# Patient Record
Sex: Male | Born: 1990 | Race: Black or African American | Hispanic: No | Marital: Single | Smoking: Never smoker
Health system: Southern US, Academic
[De-identification: ages and names within clinical notes are randomized; demographics above are authoritative.]

## PROBLEM LIST (undated history)

## (undated) DIAGNOSIS — R569 Unspecified convulsions: Secondary | ICD-10-CM

## (undated) DIAGNOSIS — G4730 Sleep apnea, unspecified: Secondary | ICD-10-CM

## (undated) DIAGNOSIS — J45909 Unspecified asthma, uncomplicated: Secondary | ICD-10-CM

## (undated) DIAGNOSIS — F419 Anxiety disorder, unspecified: Secondary | ICD-10-CM

## (undated) DIAGNOSIS — I10 Essential (primary) hypertension: Secondary | ICD-10-CM

## (undated) HISTORY — DX: Unspecified convulsions: R56.9

## (undated) HISTORY — PX: TONSILLECTOMY: SUR1361

## (undated) HISTORY — DX: Sleep apnea, unspecified: G47.30

## (undated) HISTORY — DX: Essential (primary) hypertension: I10

## (undated) HISTORY — DX: Anxiety disorder, unspecified: F41.9

---
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
## 2009-02-28 ENCOUNTER — Emergency Department (HOSPITAL_COMMUNITY): Admission: EM | Admit: 2009-02-28 | Discharge: 2009-02-28 | Payer: Self-pay | Admitting: Emergency Medicine

## 2010-01-25 ENCOUNTER — Emergency Department (HOSPITAL_COMMUNITY): Admission: EM | Admit: 2010-01-25 | Discharge: 2010-01-25 | Payer: Self-pay | Admitting: Family Medicine

## 2010-01-28 ENCOUNTER — Emergency Department (HOSPITAL_COMMUNITY): Admission: EM | Admit: 2010-01-28 | Discharge: 2010-01-28 | Payer: Self-pay | Admitting: Emergency Medicine

## 2012-02-15 ENCOUNTER — Encounter (HOSPITAL_COMMUNITY): Payer: Self-pay | Admitting: Emergency Medicine

## 2012-02-15 ENCOUNTER — Emergency Department (HOSPITAL_COMMUNITY)
Admission: EM | Admit: 2012-02-15 | Discharge: 2012-02-15 | Disposition: A | Discharge status: Home / Self Care | Payer: Self-pay | Attending: Emergency Medicine | Admitting: Emergency Medicine

## 2012-02-15 DIAGNOSIS — R05 Cough: Secondary | ICD-10-CM | POA: Insufficient documentation

## 2012-02-15 DIAGNOSIS — J40 Bronchitis, not specified as acute or chronic: Secondary | ICD-10-CM | POA: Insufficient documentation

## 2012-02-15 DIAGNOSIS — R509 Fever, unspecified: Secondary | ICD-10-CM | POA: Insufficient documentation

## 2012-02-15 DIAGNOSIS — R6883 Chills (without fever): Secondary | ICD-10-CM | POA: Insufficient documentation

## 2012-02-15 DIAGNOSIS — R059 Cough, unspecified: Secondary | ICD-10-CM | POA: Insufficient documentation

## 2012-02-15 DIAGNOSIS — J3489 Other specified disorders of nose and nasal sinuses: Secondary | ICD-10-CM | POA: Insufficient documentation

## 2012-02-15 MED ORDER — AZITHROMYCIN 250 MG PO TABS: 250.0000 mg | ORAL_TABLET | Freq: Every day | ORAL | Status: AC

## 2012-02-15 MED ORDER — ALBUTEROL SULFATE HFA 108 (90 BASE) MCG/ACT IN AERS
2.0000 | INHALATION_SPRAY | RESPIRATORY_TRACT | Status: DC
  Administered 2012-02-15: 2 via RESPIRATORY_TRACT
  Filled 2012-02-15: qty 6.7

## 2012-02-15 NOTE — Discharge Instructions (Signed)

## 2012-02-15 NOTE — ED Provider Notes (Signed)
History   This chart was scribed for Lyanne Co, MD by Clarita Crane. The patient was seen in room STRE4/STRE4. Patient's care was started at 1713.    CSN: [id number]  Arrival date & time 02/15/12  1713   First MD Initiated Contact with Patient 02/15/12 1727      Chief Complaint  Patient presents with  . Nasal Congestion    (Consider location/radiation/quality/duration/timing/severity/associated sxs/prior treatment) HPI Peter Key is a 21 y.o. male who presents to the Emergency Department complaining of moderate to severe productive cough onset several days ago and persistent since with associated nasal congestion, SOB, fever, chills and chest tightness. Denies swelling of LE, nausea, vomiting, chest pain, weakness. Denies family h/o of blood clots.  History reviewed. No pertinent past medical history.  History reviewed. No pertinent past surgical history.  History reviewed. No pertinent family history.  History  Substance Use Topics  . Smoking status: Not on file  . Smokeless tobacco: Not on file  . Alcohol Use: Not on file      Review of Systems  Constitutional: Positive for fever and chills.  HENT: Positive for congestion.   Respiratory: Positive for cough, chest tightness and shortness of breath.   Cardiovascular: Negative for chest pain and leg swelling.  Gastrointestinal: Negative for nausea and vomiting.  Neurological: Negative for weakness.  All other systems reviewed and are negative.    Allergies  Review of patient's allergies indicates no known allergies.  Home Medications   Current Outpatient Rx  Name Route Sig Dispense Refill  . IBUPROFEN 200 MG PO TABS Oral Take 400 mg by mouth every 6 (six) hours as needed. For pain    . AZITHROMYCIN 250 MG PO TABS Oral Take 1 tablet (250 mg total) by mouth daily. Take 2 tabs for first dose, then 1 tab for each additional dose 6 tablet 0    BP 125/85  Pulse 74  Temp(Src) 97.4 F (36.3 C) (Oral)   Resp 16  SpO2 100%  Physical Exam  Nursing note and vitals reviewed. Constitutional: He is oriented to person, place, and time. He appears well-developed and well-nourished. No distress.  HENT:  Head: Normocephalic and atraumatic.  Eyes: EOM are normal. Pupils are equal, round, and reactive to light.  Neck: Neck supple. No tracheal deviation present.  Cardiovascular: Normal rate and regular rhythm.  Exam reveals no gallop and no friction rub.   No murmur heard. Pulmonary/Chest: Effort normal and breath sounds normal. No respiratory distress. He has no wheezes. He has no rales.  Abdominal: Soft. He exhibits no distension.  Musculoskeletal: Normal range of motion. He exhibits no edema.  Neurological: He is alert and oriented to person, place, and time. No sensory deficit.  Skin: Skin is warm and dry.  Psychiatric: He has a normal mood and affect. His behavior is normal.    ED Course  Procedures (including critical care time)  DIAGNOSTIC STUDIES: Oxygen Saturation is 100% on room air, normal by my interpretation.    COORDINATION OF CARE: 6:28PM- Patient informed of current plan for treatment and evaluation and agrees with plan at this time.     Labs Reviewed - No data to display No results found.   1. Bronchitis       MDM  The symptoms are suggestive of bronchitis.  His O2 sats are 100%.      I personally performed the services described in this documentation, which was scribed in my presence. The recorded information has been reviewed  and considered.      Lyanne Co, MD 02/15/12 [phone]

## 2012-02-15 NOTE — ED Notes (Signed)
Pt states chest and head congestion woke him from sleep.  Pt has yellow productive cough.  Chest feels tight but no difficulty breathing.  Pt felt he had fever last night but didn't take temp.  Pt co diarrhea that started last night.

## 2013-01-01 ENCOUNTER — Encounter (HOSPITAL_COMMUNITY): Payer: Self-pay | Admitting: Emergency Medicine

## 2013-01-01 ENCOUNTER — Emergency Department (HOSPITAL_COMMUNITY)
Admission: EM | Admit: 2013-01-01 | Discharge: 2013-01-01 | Disposition: A | Discharge status: Home / Self Care | Payer: Managed Care, Other (non HMO) | Attending: Emergency Medicine | Admitting: Emergency Medicine

## 2013-01-01 DIAGNOSIS — B9789 Other viral agents as the cause of diseases classified elsewhere: Secondary | ICD-10-CM | POA: Insufficient documentation

## 2013-01-01 DIAGNOSIS — R059 Cough, unspecified: Secondary | ICD-10-CM | POA: Insufficient documentation

## 2013-01-01 DIAGNOSIS — B349 Viral infection, unspecified: Secondary | ICD-10-CM

## 2013-01-01 DIAGNOSIS — R05 Cough: Secondary | ICD-10-CM | POA: Insufficient documentation

## 2013-01-01 NOTE — ED Provider Notes (Signed)
History    This chart was scribed for non-physician practitioner working with Lyanne Co, MD by ED Scribe, Burman Nieves. This patient was seen in room WTR9/WTR9 and the patient's care was started at 3:07 PM.   CSN: [id number]  Arrival date & time 01/01/13  1427   First MD Initiated Contact with Patient 01/01/13 1507      Chief Complaint  Patient presents with  . Sore Throat    (Consider location/radiation/quality/duration/timing/severity/associated sxs/prior treatment) Patient is a 22 y.o. male presenting with pharyngitis. The history is provided by the patient. No language interpreter was used.  Sore Throat The current episode started 12 to 24 hours ago. The problem occurs constantly. The problem has not changed since onset.Pertinent negatives include no abdominal pain and no headaches. The symptoms are aggravated by swallowing. He has tried nothing for the symptoms.  Sore Throat The current episode started 12 to 24 hours ago. The problem occurs constantly. The problem has not changed since onset.Associated symptoms include coughing and a sore throat. Pertinent negatives include no abdominal pain, chills, fever, headaches, neck pain, rash or vomiting. The symptoms are aggravated by swallowing. He has tried nothing for the symptoms.   Peter Key is a 22 y.o. male who presents to the Emergency Department complaining of moderate constant sore throat onset last night. Pt reports the soreness has a scratchy sensation.  Patient then developed a productive cough with yellow sputum this morning.  Pt reports associated chest congestion. He complains he has pain in back. He denies SOB.  Pt denies fever, chills, nausea, vomiting, diarrhea, weakness, and any other associated symptoms. Pt reports he smokes tobacco about 4 cigarettes/day. Pt has medical hx tonsils removal in the past.   History reviewed. No pertinent past medical history.  History reviewed. No pertinent past surgical  history.  No family history on file.  History  Substance Use Topics  . Smoking status: Not on file  . Smokeless tobacco: Not on file  . Alcohol Use: No      Review of Systems  Constitutional: Negative for fever and chills.  HENT: Positive for sore throat. Negative for neck pain.   Respiratory: Positive for cough.   Gastrointestinal: Negative for vomiting and abdominal pain.  Skin: Negative for rash.  Neurological: Negative for headaches.  All other systems reviewed and are negative.    Allergies  Review of patient's allergies indicates no known allergies.  Home Medications  No current outpatient prescriptions on file.  BP 125/85  Pulse 98  Temp(Src) 99.2 F (37.3 C) (Oral)  Resp 18  SpO2 99%  Physical Exam  Nursing note and vitals reviewed. Constitutional: He appears well-developed and well-nourished.  HENT:  Head: Normocephalic and atraumatic. No trismus in the jaw.  Right Ear: Tympanic membrane and ear canal normal.  Left Ear: Tympanic membrane and ear canal normal.  Nose: Nose normal. Right sinus exhibits no maxillary sinus tenderness and no frontal sinus tenderness. Left sinus exhibits no maxillary sinus tenderness and no frontal sinus tenderness.  Mouth/Throat: Uvula is midline and mucous membranes are normal. No edematous. Posterior oropharyngeal erythema present. No oropharyngeal exudate, posterior oropharyngeal edema or tonsillar abscesses.  Throat is erythematous  Patient handling secretions well Normal voice phonation  Eyes: EOM are normal. Pupils are equal, round, and reactive to light.  Neck: Normal range of motion. Neck supple.  Cardiovascular: Normal rate, regular rhythm and normal heart sounds.   Pulmonary/Chest: Effort normal and breath sounds normal. No respiratory distress. He has  no wheezes. He has no rales.  Musculoskeletal: Normal range of motion.  Lymphadenopathy:    He has cervical adenopathy.  Neurological: He is alert.  Skin: Skin is  warm and dry.  Psychiatric: He has a normal mood and affect. His behavior is normal.    ED Course  Procedures (including critical care time) DIAGNOSTIC STUDIES: Oxygen Saturation is 99% on room air, normal by my interpretation.    COORDINATION OF CARE:    3:35 PM Discussed ED treatment with pt and pt agrees.   Labs Reviewed  RAPID STREP SCREEN   No results found.   No diagnosis found.    MDM  Patient presenting with sore throat onset last evening.  Rapid strep negative.  Uvula midline.  No difficulty swallowing or breathing.  No signs of peritonsillar abscess.  Patient also complaining of cough that started earlier this morning.  No respiratory rate.  Pulse ox 99 on RA.  Lungs CTAB.  Therefore, do not feel that CXR is indicated at this time.  Suspect viral illness.  Return precautions given to the patient.  I personally performed the services described in this documentation, which was scribed in my presence. The recorded information has been reviewed and is accurate.   Pascal Lux [place], PA-C 01/01/13 1630

## 2013-01-01 NOTE — ED Notes (Signed)
MD at bedside. 

## 2013-01-01 NOTE — ED Notes (Signed)
Pt complains of sore throat, body soreness x 1 day.

## 2013-01-02 NOTE — ED Provider Notes (Signed)
Medical screening examination/treatment/procedure(s) were performed by non-physician practitioner and as supervising physician I was immediately available for consultation/collaboration.   Kevin M Campos, MD 01/02/13 0100 

## 2013-01-05 ENCOUNTER — Encounter (HOSPITAL_COMMUNITY): Payer: Self-pay | Admitting: *Deleted

## 2013-01-05 ENCOUNTER — Emergency Department (HOSPITAL_COMMUNITY): Payer: Self-pay

## 2013-01-05 ENCOUNTER — Emergency Department (HOSPITAL_COMMUNITY)
Admission: EM | Admit: 2013-01-05 | Discharge: 2013-01-06 | Disposition: A | Discharge status: Home / Self Care | Payer: Self-pay | Attending: Emergency Medicine | Admitting: Emergency Medicine

## 2013-01-05 DIAGNOSIS — R109 Unspecified abdominal pain: Secondary | ICD-10-CM | POA: Insufficient documentation

## 2013-01-05 LAB — URINALYSIS, ROUTINE W REFLEX MICROSCOPIC
Bilirubin Urine: NEGATIVE
Hgb urine dipstick: NEGATIVE
Ketones, ur: NEGATIVE mg/dL
Leukocytes, UA: NEGATIVE
Urobilinogen, UA: 1 mg/dL (ref 0.0–1.0)

## 2013-01-05 MED ORDER — MORPHINE SULFATE 4 MG/ML IJ SOLN
6.0000 mg | Freq: Once | INTRAMUSCULAR | Status: AC
  Administered 2013-01-06: 6 mg via INTRAVENOUS
  Filled 2013-01-05: qty 2

## 2013-01-05 NOTE — ED Notes (Signed)
Sharp abdominal pain,  Denies nausea or vomiting,  No diarrhea,  Pain gets worse when he eats,  Even wakes him up at night,  Pain last 3 to 5 minutes,  No constipation,  No diarrhea

## 2013-01-05 NOTE — ED Notes (Signed)
Correction to prior entry,  Pt states he does have diarrhea "really bad"

## 2013-01-05 NOTE — ED Notes (Signed)
Patient transported to X-ray 

## 2013-01-06 LAB — COMPREHENSIVE METABOLIC PANEL
AST: 26 U/L (ref 0–37)
Albumin: 4.3 g/dL (ref 3.5–5.2)
Alkaline Phosphatase: 68 U/L (ref 39–117)
CO2: 26 mEq/L (ref 19–32)
Calcium: 9.3 mg/dL (ref 8.4–10.5)
GFR calc Af Amer: >90 mL/min (ref >90)
Potassium: 3.7 mEq/L (ref 3.5–5.1)
Sodium: 136 mEq/L (ref 135–145)

## 2013-01-06 LAB — CBC
HCT: 45.8 % (ref 39.0–52.0)
Hemoglobin: 16.2 g/dL (ref 13.0–17.0)
MCHC: 35.4 g/dL (ref 30.0–36.0)
Platelets: 208 10*3/uL (ref 150–400)
RBC: 5.42 MIL/uL (ref 4.22–5.81)
RDW: 13.1 % (ref 11.5–15.5)

## 2013-01-06 MED ORDER — HYDROCODONE-ACETAMINOPHEN 5-325 MG PO TABS: 1.0000 | ORAL_TABLET | ORAL | Status: DC | PRN

## 2013-01-06 NOTE — ED Provider Notes (Signed)
History     CSN: [id number]  Arrival date & time 01/05/13  2235   First MD Initiated Contact with Patient 01/05/13 2258      Chief Complaint  Patient presents with  . Abdominal Pain     The history is provided by the patient.   patient reports intermittent sharp abdominal pain over the past several days.  No nausea vomiting or diarrhea.  No fevers or chills.  Symptoms the pain is worsened by 18 and sometimes it just occurs.  At times it wakes him up at night.  The pain lasts for several minutes and then subsides.  Currently he is without significant discomfort.  He denies abdominal distention.  He denies alcohol abuse.  No history kidney stones.  No urinary complaints.  No History gallstones.  The symptoms at this time are only mild  History reviewed. No pertinent past medical history.  Past Surgical History  Procedure Laterality Date  . Tonsillectomy      History reviewed. No pertinent family history.  History  Substance Use Topics  . Smoking status: Never Smoker   . Smokeless tobacco: Not on file  . Alcohol Use: No      Review of Systems  All other systems reviewed and are negative.    Allergies  Review of patient's allergies indicates no known allergies.  Home Medications  No current outpatient prescriptions on file.  BP 117/102  Pulse 91  Temp(Src) 98.7 F (37.1 C) (Oral)  Resp 18  SpO2 99%  Physical Exam  Nursing note and vitals reviewed. Constitutional: He is oriented to person, place, and time. He appears well-developed and well-nourished.  HENT:  Head: Normocephalic and atraumatic.  Eyes: EOM are normal.  Neck: Normal range of motion.  Cardiovascular: Normal rate, regular rhythm, normal heart sounds and intact distal pulses.   Pulmonary/Chest: Effort normal and breath sounds normal. No respiratory distress.  Abdominal: Soft. He exhibits no distension. There is no tenderness. There is no rebound and no guarding.  Musculoskeletal: Normal range of  motion.  Neurological: He is alert and oriented to person, place, and time.  Skin: Skin is warm and dry.  Psychiatric: He has a normal mood and affect. Judgment normal.    ED Course  Procedures (including critical care time)  Labs Reviewed  URINALYSIS, ROUTINE W REFLEX MICROSCOPIC  CBC  COMPREHENSIVE METABOLIC PANEL  LIPASE, BLOOD   Dg Abd 2 Views  01/05/2013  *RADIOLOGY REPORT*  Clinical Data: The epigastric pain for 3 days.  Nausea.  ABDOMEN - 2 VIEW  Comparison: None.  Findings: Normal bowel gas pattern.  Scattered gas and stool in the colon.  No small or large bowel distension.  No free intra- abdominal air.  No abnormal air fluid levels.  No radiopaque stones.  Visualized bones appear intact.  IMPRESSION: Nonobstructive bowel gas pattern.   Original Report Authenticated By: Burman Nieves, M.D.    I personally reviewed the imaging tests through PACS system I reviewed available ER/hospitalization records through the EMR    1. abdominal pain   MDM  3:42 AM Patient feels much better at this time.  Discharge home in good condition.  The patient will need to followup with the gastroenterologist as an outpatient.  Very mild elevation in his lipase at the patient's continuing to keep food down in the emergency department.  LFTs are normal.  No indication for imaging at this time.  He understands return to ER for new or worsening symptoms  Lyanne Co, MD 01/06/13 [phone]

## 2013-01-06 NOTE — ED Notes (Signed)
Pt alert x 4 discharge papers given. V/s stable, no c/o pain will monitor.

## 2013-01-23 ENCOUNTER — Emergency Department (HOSPITAL_COMMUNITY): Payer: Managed Care, Other (non HMO)

## 2013-01-23 ENCOUNTER — Emergency Department (HOSPITAL_COMMUNITY)
Admission: EM | Admit: 2013-01-23 | Discharge: 2013-01-23 | Disposition: A | Discharge status: Home / Self Care | Payer: Managed Care, Other (non HMO) | Attending: Emergency Medicine | Admitting: Emergency Medicine

## 2013-01-23 ENCOUNTER — Encounter (HOSPITAL_COMMUNITY): Payer: Self-pay | Admitting: Emergency Medicine

## 2013-01-23 DIAGNOSIS — S298XXA Other specified injuries of thorax, initial encounter: Secondary | ICD-10-CM | POA: Insufficient documentation

## 2013-01-23 DIAGNOSIS — Y92410 Unspecified street and highway as the place of occurrence of the external cause: Secondary | ICD-10-CM | POA: Insufficient documentation

## 2013-01-23 DIAGNOSIS — S59909A Unspecified injury of unspecified elbow, initial encounter: Secondary | ICD-10-CM | POA: Insufficient documentation

## 2013-01-23 DIAGNOSIS — S6990XA Unspecified injury of unspecified wrist, hand and finger(s), initial encounter: Secondary | ICD-10-CM | POA: Insufficient documentation

## 2013-01-23 DIAGNOSIS — Y9389 Activity, other specified: Secondary | ICD-10-CM | POA: Insufficient documentation

## 2013-01-23 DIAGNOSIS — R0789 Other chest pain: Secondary | ICD-10-CM

## 2013-01-23 MED ORDER — METHOCARBAMOL 500 MG PO TABS: 500.0000 mg | ORAL_TABLET | Freq: Two times a day (BID) | ORAL | Status: DC

## 2013-01-23 MED ORDER — HYDROCODONE-ACETAMINOPHEN 5-325 MG PO TABS: 2.0000 | ORAL_TABLET | ORAL | Status: DC | PRN

## 2013-01-23 MED ORDER — OXYCODONE-ACETAMINOPHEN 5-325 MG PO TABS
1.0000 | ORAL_TABLET | Freq: Once | ORAL | Status: AC
  Administered 2013-01-23: 1 via ORAL
  Filled 2013-01-23: qty 1

## 2013-01-23 MED ORDER — IBUPROFEN 800 MG PO TABS
800.0000 mg | ORAL_TABLET | Freq: Once | ORAL | Status: AC
  Administered 2013-01-23: 800 mg via ORAL
  Filled 2013-01-23: qty 1
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
## 2013-01-23 NOTE — ED Notes (Signed)
Bed:WA02<BR> Expected date:<BR> Expected time:<BR> Means of arrival:<BR> Comments:<BR> EMS

## 2013-10-21 ENCOUNTER — Emergency Department (HOSPITAL_COMMUNITY)
Admission: EM | Admit: 2013-10-21 | Discharge: 2013-10-21 | Disposition: A | Discharge status: Home / Self Care | Payer: Managed Care, Other (non HMO) | Attending: Emergency Medicine | Admitting: Emergency Medicine

## 2013-10-21 ENCOUNTER — Encounter (HOSPITAL_COMMUNITY): Payer: Self-pay | Admitting: Emergency Medicine

## 2013-10-21 DIAGNOSIS — J40 Bronchitis, not specified as acute or chronic: Secondary | ICD-10-CM | POA: Insufficient documentation

## 2013-10-21 DIAGNOSIS — IMO0001 Reserved for inherently not codable concepts without codable children: Secondary | ICD-10-CM | POA: Insufficient documentation

## 2013-10-21 DIAGNOSIS — J029 Acute pharyngitis, unspecified: Secondary | ICD-10-CM | POA: Insufficient documentation

## 2013-10-21 DIAGNOSIS — Z9089 Acquired absence of other organs: Secondary | ICD-10-CM | POA: Insufficient documentation

## 2013-10-21 MED ORDER — BENZONATATE 100 MG PO CAPS: 100.0000 mg | ORAL_CAPSULE | Freq: Three times a day (TID) | ORAL | Status: DC

## 2013-10-21 NOTE — ED Provider Notes (Signed)
CSN: [id number]     Arrival date & time 10/21/13  1253 History   First MD Initiated Contact with Patient 10/21/13 1416    This chart was scribed for Peter Key, a non-physician practitioner working with Hurman Horn, MD by Lewanda Rife, ED Scribe. This patient was seen in room WTR6/WTR6 and the patient's care was started at 3:01 PM     Chief Complaint  Patient presents with  . Nasal Congestion  . Cough   (Consider location/radiation/quality/duration/timing/severity/associated sxs/prior Treatment) The history is provided by the patient. No language interpreter was used.   HPI Comments: Peter Key is a 22 y.o. male who presents to the Emergency Department complaining of constant worsening nasal congestion onset 2 days. Reports associated productive cough with yellow sputum, generalized myalgias, sneezing, and sore throat. Reports trying cough drops with no relief of symptoms. Denies any aggravating factors. Denies associated fever, otalgia, chills, nausea, emesis and diarrhea. Denies getting a flu shot this season. Denies smoking cigarettes.     History reviewed. No pertinent past medical history. Past Surgical History  Procedure Laterality Date  . Tonsillectomy     No family history on file. History  Substance Use Topics  . Smoking status: Never Smoker   . Smokeless tobacco: Not on file  . Alcohol Use: No    Review of Systems  Constitutional: Negative for fever and chills.  HENT: Positive for congestion and sore throat.   Respiratory: Positive for cough.   Gastrointestinal: Negative for nausea, vomiting and diarrhea.  Musculoskeletal: Positive for myalgias.  Psychiatric/Behavioral: Negative for confusion.  All other systems reviewed and are negative.    Allergies  Review of patient's allergies indicates no known allergies.  Home Medications   Current Outpatient Rx  Name  Route  Sig  Dispense  Refill  . benzonatate (TESSALON) 100 MG capsule   Oral  Take 1 capsule (100 mg total) by mouth every 8 (eight) hours.   15 capsule   0    BP 119/71  Pulse 92  Temp(Src) 98.8 F (37.1 C) (Oral)  Resp 14  SpO2 99% Physical Exam  Nursing note and vitals reviewed. Constitutional: He appears well-developed and well-nourished. No distress.  HENT:  Head: Normocephalic and atraumatic.  Right Ear: Tympanic membrane, external ear and ear canal normal.  Left Ear: Tympanic membrane, external ear and ear canal normal.  Nose: Nose normal. No mucosal edema or rhinorrhea.  Mouth/Throat: Uvula is midline and mucous membranes are normal. Mucous membranes are not dry. No trismus in the jaw. No uvula swelling. Posterior oropharyngeal erythema present. No oropharyngeal exudate, posterior oropharyngeal edema or tonsillar abscesses.  Eyes: Conjunctivae and EOM are normal. Right eye exhibits no discharge. Left eye exhibits no discharge.  Neck: Normal range of motion and full passive range of motion without pain. Neck supple. No tracheal deviation present.  No meningismus   Cardiovascular: Normal rate, regular rhythm, normal heart sounds, intact distal pulses and normal pulses.   Pulses:      Radial pulses are 2+ on the right side, and 2+ on the left side.  Pulmonary/Chest: Effort normal and breath sounds normal. No respiratory distress. He has no wheezes. He has no rales.  No rhonchi   Abdominal: Soft. There is no tenderness.  Musculoskeletal: Normal range of motion.  Neurological: He is alert.  Skin: Skin is warm and dry.  Psychiatric: He has a normal mood and affect. His behavior is normal.    ED Course  Procedures (including critical  care time)  COORDINATION OF CARE:  Nursing notes reviewed. Vital signs reviewed. Initial pt interview and examination performed.   3:06 PM-Discussed treatment plan with pt at bedside. Pt agrees with plan.   Treatment plan initiated:Medications - No data to display   Initial diagnostic testing ordered.    Labs  Review Labs Reviewed - No data to display Imaging Review No results found.  EKG Interpretation   None      Vital signs reviewed and are as follows: Filed Vitals:   10/21/13 1530  BP: 113/60  Pulse: 86  Temp: 99 F (37.2 C)  Resp: 16   Patient counseled on supportive care for viral symptoms and s/s to return including worsening symptoms, persistent fever, persistent vomiting, or if they have any other concerns.  Urged to see PCP if symptoms persist for more than 3 days. Patient verbalizes understanding and agrees with plan.   D/c to home with tessalon.    MDM   1. Bronchitis    Patient with likely viral bronchitis. No concern for PNA given normal lung exam. Antibiotics not indicated. Conservative therapy indicated.    I personally performed the services described in this documentation, which was scribed in my presence. The recorded information has been reviewed and is accurate.    [name], PA-C 10/21/13 1534

## 2013-10-21 NOTE — ED Notes (Signed)
Per pt, chest, nasal congestion since the 26 th-coughing up yellow mucous

## 2013-10-22 NOTE — ED Provider Notes (Signed)
Medical screening examination/treatment/procedure(s) were performed by non-physician practitioner and as supervising physician I was immediately available for consultation/collaboration.  Hurman Horn, MD 10/22/13 [phone]

## 2014-01-23 ENCOUNTER — Emergency Department (HOSPITAL_BASED_OUTPATIENT_CLINIC_OR_DEPARTMENT_OTHER): Payer: Managed Care, Other (non HMO)

## 2014-01-23 ENCOUNTER — Emergency Department (HOSPITAL_BASED_OUTPATIENT_CLINIC_OR_DEPARTMENT_OTHER)
Admission: EM | Admit: 2014-01-23 | Discharge: 2014-01-23 | Disposition: A | Discharge status: Home / Self Care | Payer: Managed Care, Other (non HMO) | Attending: Emergency Medicine | Admitting: Emergency Medicine

## 2014-01-23 DIAGNOSIS — Y990 Civilian activity done for income or pay: Secondary | ICD-10-CM | POA: Insufficient documentation

## 2014-01-23 DIAGNOSIS — Y9389 Activity, other specified: Secondary | ICD-10-CM | POA: Insufficient documentation

## 2014-01-23 DIAGNOSIS — T59891A Toxic effect of other specified gases, fumes and vapors, accidental (unintentional), initial encounter: Secondary | ICD-10-CM | POA: Insufficient documentation

## 2014-01-23 DIAGNOSIS — Y9289 Other specified places as the place of occurrence of the external cause: Secondary | ICD-10-CM | POA: Insufficient documentation

## 2014-01-23 DIAGNOSIS — T5991XA Toxic effect of unspecified gases, fumes and vapors, accidental (unintentional), initial encounter: Secondary | ICD-10-CM | POA: Insufficient documentation

## 2014-01-23 NOTE — ED Provider Notes (Signed)
Medical screening examination/treatment/procedure(s) were performed by non-physician practitioner and as supervising physician I was immediately available for consultation/collaboration.   EKG Interpretation None        Alfred Harrel, MD 01/23/14 2353 

## 2014-01-23 NOTE — ED Notes (Signed)
Patient transported to X-ray 

## 2014-01-23 NOTE — Discharge Instructions (Signed)
Poisoning Information, Adult  Poisoning is illness caused by eating, drinking, touching, or inhaling a harmful substance. The damaging effects on the person's health will vary depending on the type of poison, the amount of exposure, and the duration of exposure before treatment. These effects may range from mild to very severe or even fatal.   Most poisonings take place in the home and involve common household products. They can also occur in the workplace, especially in industrial or manufacturing facilities. Poisoning is more common in children than adults. However, poisoning often causes more serious illness in adults. Poisonings are often accidental, but there are also many cases in which a person intentionally ingests poison.  WHAT THINGS MAY BE POISONOUS?   A poison can be any substance that causes illness or harm to the body. Poisoning is often caused by products that are commonly found in homes. Many substances can become poisonous if used in ways or amounts that are not appropriate. Some common products that can cause poisoning are:   · Medicines, including prescription medicines, over-the-counter pain medicines, vitamins, iron pills, and herbal supplements.  · Cleaning or laundry products.  · Paint and paint thinner.  · Weed or insect killers.  · Perfume, hair spray, or nail products.  · Alcohol.  · Plants, such as philodendron, poinsettia, oleander, castor bean, cactus, and tomato plants.  · Batteries.  · Furniture polish.  · Drain cleaners.  · Antifreeze or other automotive products.  · Gasoline, lighter fluid, or lamp oil.  · Carbon monoxide gas from furnaces or automobiles.  · Toxic fumes from the burning of plastics or certain other materials.  WHAT ARE SOME FIRST-AID MEASURES FOR POISONING?  The local poison control center must be contacted whenever a person may have been exposed to poison. The poison control specialist will often give a set of directions to follow over the phone. These directions  may include the following:  · Remove any substance that is still in the mouth if the poison was not food or medicine. Drink a small amount of water.  · Keep the medicine container if too much medicine or the wrong medicine was swallowed. Use it to identify the medicine to the poison control specialist.   · Get away from the area where exposure occurred as soon as possible if the poison was from fumes or chemicals.  · Get fresh air as soon as possible if a poison was inhaled.  · Remove any affected clothing and rinse the skin with water if a poison got on the skin.   · Rinse the eyes with water if a poison or chemical got in the eyes.  · Begin cardiopulmonary resuscitation (CPR) if breathing stops.  HOW CAN YOU PREVENT POISONING?  Take these steps to help prevent poisoning:  · Keep medicines and chemical products in their original containers. Many of these come in child-safe packaging. Store them in areas out of reach of children.  · Educate others about the dangers of possible poisons.  · Read labels before using medicine or household products. Leave the original labels on the containers.  · Always turn on a light when taking medicine. Check the dosage every time.    · Close the containers tightly after using medicine or chemical products.  · Get rid of unneeded and outdated medicines by following the specific disposal instructions on the medicine label or the patient information that came with the medicine. Do not put medicine in the trash or flush it down the toilet. Use the community's   drug take-back program to dispose of medicine. If these options are not available, take the medicine out of the original container and mix it with an undesirable substance, such as coffee grounds or kitty litter. Seal the mixture in a sealable bag, can, or other container and throw it away.   · Keep all dangerous household products (such as lighter fluid, paint thinner and remover, gasoline, and antifreeze) in locked  cabinets.  · Do not mix different household chemicals with each other.  · Use protective equipment (gloves, goggles, masks, aprons) as needed when using chemicals or cleaners.  · Install a carbon monoxide detector in your home.  WHEN SHOULD YOU SEEK HELP?   Contact the poison control center whenever you suspect that a person has been exposed to poison. Call 1-800-222-1222 (in the [place] to reach a poison center for your area. If you are outside the [place] ask your caregiver what the phone number is for your local poison control center. Keep the phone number posted near your phone. Make sure everyone in your household knows where to find the number.  The local emergency services (911 in [place] must be contacted if a person has been exposed to poison and:   · Has trouble breathing or stops breathing.  · Develops chest pain.  · Has trouble staying awake or becomes unconscious.  · Has a seizure.  · Has severe vomiting or bleeding.  · Has a worsening headache.  · Has a decreased level of alertness.  · Develops a widespread rash that may or may not be painful.  · Has changes in vision.  · Has difficulty swallowing.  · Develops severe abdominal pain.  FOR MORE INFORMATION   American Association of Poison Control Centers: www.aapcc.org  Document Released: 09/27/2012 Document Reviewed: 09/27/2012  Exit[employer]® Patient Information ©2014 [employer].

## 2014-01-23 NOTE — ED Notes (Signed)
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 Denies all other complaints. RT listened to lungs while in triage and reports breath sounds are clear.

## 2014-01-23 NOTE — ED Provider Notes (Signed)
CSN: [id number]     Arrival date & time 01/23/14  1512 History   First MD Initiated Contact with Patient 01/23/14 1630     Chief Complaint  Patient presents with  . Aspiration     (Consider location/radiation/quality/duration/timing/severity/associated sxs/prior Treatment) Patient is a 23 y.o. male presenting with shortness of breath. The history is provided by the patient. No language interpreter was used.  Shortness of Breath Severity:  Moderate Onset quality:  Sudden Progression:  Improving Chronicity:  New Relieved by:  Nothing Worsened by:  Nothing tried Ineffective treatments:  None tried Associated symptoms: vomiting   Risk factors: tobacco use   Risk factors: no recent alcohol use   Pt reports he inhaled a chemical at work.  Pt was exposed for about 30 seconds.   Pt reports he coughed until he vomitted.  Pt felt like he aspirated vomit.   Pt given 02 by EMS.   Pt reports he feels back to normal now  No past medical history on file. No past surgical history on file. No family history on file. History  Substance Use Topics  . Smoking status: Not on file  . Smokeless tobacco: Not on file  . Alcohol Use: Not on file    Review of Systems  Respiratory: Positive for shortness of breath.   Gastrointestinal: Positive for vomiting.  All other systems reviewed and are negative.      Allergies  Review of patient's allergies indicates no known allergies.  Home Medications  No current outpatient prescriptions on file. BP 114/76  Pulse 82  Temp(Src) 98.6 F (37 C) (Oral)  Resp 16  Ht 5\' 6"  (1.676 m)  Wt 156 lb (70.761 kg)  BMI 25.19 kg/m2  SpO2 100% Physical Exam  Nursing note and vitals reviewed. Constitutional: He is oriented to person, place, and time. He appears well-developed and well-nourished.  HENT:  Head: Normocephalic.  Right Ear: External ear normal.  Left Ear: External ear normal.  Mouth/Throat: Oropharynx is clear and moist.  Eyes: Conjunctivae  and EOM are normal. Pupils are equal, round, and reactive to light.  Neck: Normal range of motion.  Cardiovascular: Normal rate and normal heart sounds.   Pulmonary/Chest: Effort normal.  Abdominal: Soft. He exhibits no distension.  Musculoskeletal: Normal range of motion.  Neurological: He is alert and oriented to person, place, and time.  Skin: Skin is warm.  Psychiatric: He has a normal mood and affect.    ED Course  Procedures (including critical care time) Labs Review Labs Reviewed - No data to display Imaging Review Dg Chest 2 View  01/23/2014   CLINICAL DATA:  Clinical spill. Inhaled fumes.  EXAM: CHEST  2 VIEW  COMPARISON:  None.  FINDINGS: The heart size and mediastinal contours are within normal limits. Both lungs are clear. The visualized skeletal structures are unremarkable.  IMPRESSION: Normal chest radiographs   Electronically Signed   By: Amie Portlandavid  Ormond M.D.   On: 01/23/2014 17:20     EKG Interpretation None      MDM   Final diagnoses:  Inhalation of noxious fumes    MSDS reviewed,  Pt asymptomatic with normal chest xray.   Pt advised to return if any problems.    Lonia SkinnerLeslie K CrescentSofia, PA-C 01/23/14 1739

## 2015-02-08 ENCOUNTER — Emergency Department (HOSPITAL_COMMUNITY)
Admission: EM | Admit: 2015-02-08 | Discharge: 2015-02-08 | Disposition: A | Discharge status: Home / Self Care | Payer: Managed Care, Other (non HMO) | Attending: Emergency Medicine | Admitting: Emergency Medicine

## 2015-02-08 ENCOUNTER — Encounter (HOSPITAL_COMMUNITY): Payer: Self-pay

## 2015-02-08 DIAGNOSIS — I159 Secondary hypertension, unspecified: Secondary | ICD-10-CM | POA: Insufficient documentation

## 2015-02-08 DIAGNOSIS — R5383 Other fatigue: Secondary | ICD-10-CM | POA: Diagnosis present

## 2015-02-08 DIAGNOSIS — R002 Palpitations: Secondary | ICD-10-CM | POA: Diagnosis not present

## 2015-02-08 DIAGNOSIS — F419 Anxiety disorder, unspecified: Secondary | ICD-10-CM | POA: Insufficient documentation

## 2015-02-08 DIAGNOSIS — Z720 Tobacco use: Secondary | ICD-10-CM | POA: Insufficient documentation

## 2015-02-08 NOTE — ED Notes (Signed)
Sandwich and drink given prior to discharge

## 2015-02-08 NOTE — ED Notes (Signed)
Pt reports blood pressure checked at work today and it was BP 173/110 because pt was feeling jittery.  No other s/s noted.

## 2015-02-08 NOTE — ED Provider Notes (Signed)
CSN: [id number]     Arrival date & time 02/08/15  1751 History   None    Chief Complaint  Patient presents with  . Hypertension     (Consider location/radiation/quality/duration/timing/severity/associated sxs/prior Treatment) Patient is a 24 y.o. male presenting with hypertension. The history is provided by the patient. No language interpreter was used.  Hypertension This is a new problem. The current episode started today. The problem occurs constantly. The problem has been gradually improving. Associated symptoms include fatigue. Pertinent negatives include no abdominal pain, anorexia, chest pain, congestion, coughing, fever, headaches, nausea, neck pain, numbness, vertigo, vomiting or weakness. The symptoms are aggravated by stress. He has tried nothing for the symptoms. The treatment provided no relief.    History reviewed. No pertinent past medical history. Past Surgical History  Procedure Laterality Date  . Tonsillectomy     History reviewed. No pertinent family history. History  Substance Use Topics  . Smoking status: Current Every Day Smoker -- 0.25 packs/day    Types: Cigarettes  . Smokeless tobacco: Not on file  . Alcohol Use: Yes     Comment: occ    Review of Systems  Constitutional: Positive for fatigue. Negative for fever.  HENT: Negative for congestion.   Respiratory: Negative for cough, chest tightness and shortness of breath.   Cardiovascular: Positive for palpitations. Negative for chest pain.  Gastrointestinal: Negative for nausea, vomiting, abdominal pain and anorexia.  Musculoskeletal: Negative for neck pain.  Neurological: Negative for vertigo, speech difficulty, weakness, light-headedness, numbness and headaches.  Psychiatric/Behavioral: Negative for confusion. The patient is nervous/anxious.   All other systems reviewed and are negative.     Allergies  Review of patient's allergies indicates no known allergies.  Home Medications   Prior to  Admission medications   Medication Sig Start Date End Date Taking? Authorizing Provider  benzonatate (TESSALON) 100 MG capsule Take 1 capsule (100 mg total) by mouth every 8 (eight) hours. 10/21/13   Renne CriglerJoshua Geiple, PA-C   BP 133/93 mmHg  Pulse 104  Temp(Src) 98.3 F (36.8 C) (Oral)  Resp 18  Ht 5\' 6"  (1.676 m)  Wt 149 lb 14.4 oz (67.994 kg)  BMI 24.21 kg/m2  SpO2 99%   Filed Vitals:   02/08/15 1812 02/08/15 1900 02/08/15 1930  BP: 133/93 116/67 105/75  Pulse: 104 79 66  Temp: 98.3 F (36.8 C)    TempSrc: Oral    Resp: 18  19  Height: 5\' 6"  (1.676 m)    Weight: 149 lb 14.4 oz (67.994 kg)    SpO2: 99% 98% 97%    Physical Exam  Constitutional: He is oriented to person, place, and time. Vital signs are normal. He appears well-developed and well-nourished. He does not appear ill. No distress.  HENT:  Head: Normocephalic and atraumatic.  Nose: Nose normal.  Mouth/Throat: Oropharynx is clear and moist. No oropharyngeal exudate.  Eyes: EOM are normal. Pupils are equal, round, and reactive to light.  Neck: Normal range of motion. Neck supple.  Cardiovascular: Normal rate, regular rhythm, normal heart sounds and intact distal pulses.   No murmur heard. Pulmonary/Chest: Effort normal and breath sounds normal. No respiratory distress. He has no wheezes. He exhibits no tenderness.  Abdominal: Soft. He exhibits no distension. There is no tenderness. There is no guarding.  Musculoskeletal: Normal range of motion. He exhibits no tenderness.  Neurological: He is alert and oriented to person, place, and time. No cranial nerve deficit. Coordination normal.  Full strength and sensation, normal gait and  coordination.   Skin: Skin is warm and dry. He is not diaphoretic. No pallor.  Psychiatric: He has a normal mood and affect. His behavior is normal. Judgment and thought content normal.  Nursing note and vitals reviewed.   ED Course  Procedures (including critical care time) Labs  Review Labs Reviewed - No data to display  Imaging Review No results found.   EKG Interpretation None      MDM   Final diagnoses:  Secondary hypertension, unspecified  Anxiety   Pt is a 24 yo M with no PMH who presents with anxiety and concern for HTN.  He was at work last night when he began to feel very stressed out and anxious.  He had mild palpitations and nausea.  One of his coworkers checked his BP cuff and it was reportedly 170 systolic.  He went home, took a nap, then went to [employer] to re-check his BP and it was 150 systolic.  He then presented to the ED for further evaluation.   He reports multiple recent stressors, has increased his hours at work this week.  He also recently started alternating between evening shift and day shift and complains of fatigue since starting this.  Denies stimulant use, EtOH, or drugs.  Occasional tobacco use.   Pt is well appearing, in NAD.  Benign exam.  Blood pressure benign here.  No family hx of HTN or CAD at young age.   Doubt acute emergent pathology today.  He had several benign BP readings while in the ED, with range from 100-130 systolic.  He denies any symptoms other than anxiety, and has a hx of this.   He was advised that he should follow up with a PCP in 1-2 weeks for a repeat BP check.  Encouraged to try to keep a habitual sleep routine even if his schedule changes frequently. Advised on stress relieving activities and pt was discharged in stable condition.   Patient was seen with ED Attending, Dr. Ellamae Sia, MD  Lenell Antu, MD 02/09/15 4098  Raeford Razor, MD 02/11/15 [phone]

## 2016-03-15 ENCOUNTER — Emergency Department (HOSPITAL_COMMUNITY): Admission: EM | Admit: 2016-03-15 | Discharge: 2016-03-15 | Discharge status: Absent without leave | Payer: Self-pay

## 2016-11-08 ENCOUNTER — Encounter (HOSPITAL_BASED_OUTPATIENT_CLINIC_OR_DEPARTMENT_OTHER): Payer: Self-pay | Admitting: *Deleted

## 2016-11-08 ENCOUNTER — Emergency Department (HOSPITAL_BASED_OUTPATIENT_CLINIC_OR_DEPARTMENT_OTHER)
Admission: EM | Admit: 2016-11-08 | Discharge: 2016-11-08 | Disposition: A | Discharge status: Home / Self Care | Payer: Self-pay | Attending: Emergency Medicine | Admitting: Emergency Medicine

## 2016-11-08 ENCOUNTER — Emergency Department (HOSPITAL_BASED_OUTPATIENT_CLINIC_OR_DEPARTMENT_OTHER): Payer: Self-pay

## 2016-11-08 DIAGNOSIS — R079 Chest pain, unspecified: Secondary | ICD-10-CM

## 2016-11-08 DIAGNOSIS — R0602 Shortness of breath: Secondary | ICD-10-CM

## 2016-11-08 DIAGNOSIS — F410 Panic disorder [episodic paroxysmal anxiety] without agoraphobia: Secondary | ICD-10-CM | POA: Insufficient documentation

## 2016-11-08 DIAGNOSIS — F17210 Nicotine dependence, cigarettes, uncomplicated: Secondary | ICD-10-CM | POA: Insufficient documentation

## 2016-11-08 NOTE — ED Provider Notes (Signed)
[facility] Provider Note   CSN: [id number] Arrival date & time: 11/08/16  1932  By signing my name below, I, Arianna Nassar, attest that this documentation has been prepared under the direction and in the presence of Nira Conn, MD.  Electronically Signed: Octavia Heir, ED Scribe. 11/08/16. 11:03 PM.    History   Chief Complaint Chief Complaint  Patient presents with  . Shortness of Breath   The history is provided by the patient. No language interpreter was used.   HPI Comments: Peter Key is a 26 y.o. male who presents to the Emergency Department complaining of sudden onset, resolved, shortness of breath that began about 4 hours ago. He reports associated left sided chest pain that he described as a tight sensation. Pt expresses that his symptoms have fully resolved and believes that his symptoms were due to having a panic attack. Pt reports he was at work this evening when he began to feel jittery and started to have palpitations. He was seen for similar presentation of symptoms in 2016. Pt further notes being under increased stress lately. He reports going on a 5 hour car ride ~ 2 weeks ago but expresses he got out of the car. Pt denies cough, fever, rhinorrhea, congestion, leg swelling, leg pain, recent surgeries, or  hormonal replacement therapies. He further denies hx of DM or Pt is a smoker. History reviewed. No pertinent past medical history.  There are no active problems to display for this patient.   Past Surgical History:  Procedure Laterality Date  . TONSILLECTOMY         Home Medications    Prior to Admission medications   Medication Sig Start Date End Date Taking? Authorizing Provider  benzonatate (TESSALON) 100 MG capsule Take 1 capsule (100 mg total) by mouth every 8 (eight) hours. 10/21/13   [name], PA-C    Family History No family history on file.  Social History Social History  Substance Use Topics  . Smoking  status: Current Every Day Smoker    Packs/day: 0.25    Types: Cigarettes  . Smokeless tobacco: Never Used  . Alcohol use Yes     Comment: occ     Allergies   Patient has no known allergies.   Review of Systems Review of Systems  A complete 10 system review of systems was obtained and all systems are negative except as noted in the HPI and PMH.   Physical Exam Updated Vital Signs Initial vitals: BP 131/100   Pulse 99   Temp 98.4 F (36.9 C) (Oral)   Resp 16   Ht 5\' 6"  (1.676 m)   Wt 176 lb (79.8 kg)   SpO2 100%   BMI 28.41 kg/m   Physical Exam  Constitutional: He is oriented to person, place, and time. He appears well-developed and well-nourished. No distress.  HENT:  Head: Normocephalic and atraumatic.  Nose: Nose normal.  Eyes: Conjunctivae and EOM are normal. Pupils are equal, round, and reactive to light. Right eye exhibits no discharge. Left eye exhibits no discharge. No scleral icterus.  Neck: Normal range of motion. Neck supple.  Cardiovascular: Normal rate and regular rhythm.  Exam reveals no gallop and no friction rub.   No murmur heard. Pulmonary/Chest: Effort normal and breath sounds normal. No stridor. No respiratory distress. He has no rales.  Abdominal: Soft. He exhibits no distension. There is no tenderness.  Musculoskeletal: He exhibits no edema or tenderness.  Neurological: He is alert and oriented  to person, place, and time.  Skin: Skin is warm and dry. No rash noted. He is not diaphoretic. No erythema.  Psychiatric: He has a normal mood and affect.  Vitals reviewed.    ED Treatments / Results  DIAGNOSTIC STUDIES: Oxygen Saturation is 100% on RA, normal by my interpretation.  COORDINATION OF CARE:  11:02 PM Discussed treatment plan with pt at bedside and pt agreed to plan.  Labs (all labs ordered are listed, but only abnormal results are displayed) Labs Reviewed - No data to display  EKG  EKG Interpretation  Date/Time:  Monday November 08 2016 19:51:40 EST Ventricular Rate:  94 PR Interval:  146 QRS Duration: 100 QT Interval:  340 QTC Calculation: 425 R Axis:   94 Text Interpretation:  Normal sinus rhythm Rightward axis Borderline ECG No old tracing to compare Confirmed by [facility]CARDAMA MD, Claribel Sachs [phone]) on 11/08/2016 8:09:16 PM       Radiology Dg Chest 2 View  Result Date: 11/08/2016 CLINICAL DATA:  26 y/o  M; 2 days of shortness of breath. EXAM: CHEST  2 VIEW COMPARISON:  01/23/2013 chest radiograph FINDINGS: The heart size and mediastinal contours are within normal limits and stable. Both lungs are clear. The visualized skeletal structures are unremarkable. IMPRESSION: No active cardiopulmonary disease. Electronically Signed   By: Mitzi HansenLance  Furusawa-Stratton M.D.   On: 11/08/2016 23:07    Procedures Procedures (including critical care time)  Medications Ordered in ED Medications - No data to display   Initial Impression / Assessment and Plan / ED Course  I have reviewed the triage vital signs and the nursing notes.  Pertinent labs & imaging results that were available during my care of the patient were reviewed by me and considered in my medical decision making (see chart for details).  Clinical Course     Atypical chest pain most consistent with panic attack. EKG without acute ischemic changes or evidence of pericarditis. Chest x-ray without evidence suggestive of pneumonia, pneumothorax, pneumomediastinum.  No abnormal contour of the mediastinum to suggest dissection. No evidence of acute injuries. Presentation is highly inconsistent with ACS and pt is low risk so I do not feel that cardiac work up is warranted at this time.  Low pretest probability for PE. Presentation not classic for aortic dissection or esophageal perforation.  The patient is safe for discharge with strict return precautions.    Final Clinical Impressions(s) / ED Diagnoses   Final diagnoses:  None   Disposition: Discharge  Condition:  Good  I have discussed the results, Dx and Tx plan with the patient who expressed understanding and agree(s) with the plan. Discharge instructions discussed at great length. The patient was given strict return precautions who verbalized understanding of the instructions. No further questions at time of discharge.    New Prescriptions   No medications on file    Follow Up: primary care provider  Schedule an appointment as soon as possible for a visit  As needed   I personally performed the services described in this documentation, which was scribed in my presence. The recorded information has been reviewed and is accurate.        Nira ConnPedro Eduardo Hadley Soileau, MD 11/08/16 [phone]

## 2016-11-08 NOTE — ED Notes (Signed)
Pt. Anxious and stated he felt better and would just go on home.  Encouraged Pt. To stay and see the EDP.  Explained to Pt. That he has other things to see EDP for and anxiety may not be the entire complaint.  Encouraged Pt. To stay for visit.  Pt. Agreed to stay.

## 2016-11-08 NOTE — ED Triage Notes (Signed)
Sob and lightheaded yesterday. States he slept and drank water. His grandmother took his BP at home and it was elevated. States he is forcing himself to take a breath. He is amb speaking in complete sentences. No distress.

## 2017-05-24 ENCOUNTER — Emergency Department (HOSPITAL_COMMUNITY)
Admission: EM | Admit: 2017-05-24 | Discharge: 2017-05-24 | Disposition: A | Discharge status: Home / Self Care | Payer: Self-pay | Attending: Emergency Medicine | Admitting: Emergency Medicine

## 2017-05-24 ENCOUNTER — Emergency Department (HOSPITAL_COMMUNITY): Payer: Self-pay

## 2017-05-24 ENCOUNTER — Encounter (HOSPITAL_COMMUNITY): Payer: Self-pay | Admitting: Emergency Medicine

## 2017-05-24 DIAGNOSIS — F17200 Nicotine dependence, unspecified, uncomplicated: Secondary | ICD-10-CM | POA: Insufficient documentation

## 2017-05-24 DIAGNOSIS — R0789 Other chest pain: Secondary | ICD-10-CM | POA: Insufficient documentation

## 2017-05-24 DIAGNOSIS — R0602 Shortness of breath: Secondary | ICD-10-CM | POA: Insufficient documentation

## 2017-05-24 HISTORY — DX: Anxiety disorder, unspecified: F41.9

## 2017-05-24 LAB — BASIC METABOLIC PANEL
Anion gap: 7 (ref 5–15)
BUN: 11 mg/dL (ref 6–20)
CO2: 28 mmol/L (ref 22–32)
Calcium: 9.3 mg/dL (ref 8.9–10.3)
Chloride: 103 mmol/L (ref 101–111)
Creatinine, Ser: 1.14 mg/dL (ref 0.61–1.24)
GFR calc Af Amer: >60 mL/min (ref >60)
GFR calc non Af Amer: >60 mL/min (ref >60)
Glucose, Bld: 94 mg/dL (ref 65–99)
Potassium: 4.1 mmol/L (ref 3.5–5.1)
Sodium: 138 mmol/L (ref 135–145)

## 2017-05-24 LAB — CBC
HCT: 48.7 % (ref 39.0–52.0)
Hemoglobin: 16.8 g/dL (ref 13.0–17.0)
MCH: 29.3 pg (ref 26.0–34.0)
MCHC: 34.5 g/dL (ref 30.0–36.0)
MCV: 84.8 fL (ref 78.0–100.0)
Platelets: 234 10*3/uL (ref 150–400)
RBC: 5.74 MIL/uL (ref 4.22–5.81)
RDW: 13.6 % (ref 11.5–15.5)
WBC: 6.9 10*3/uL (ref 4.0–10.5)

## 2017-05-24 LAB — I-STAT TROPONIN, ED: Troponin i, poc: 0 ng/mL (ref 0.00–0.08)

## 2017-05-24 NOTE — ED Provider Notes (Signed)
[facility]-EMERGENCY DEPT Provider Note   CSN: [id number] Arrival date & time: 05/24/17  1042  By signing my name below, I, Vista Minkobert Ross, attest that this documentation has been prepared under the direction and in the presence of H&R BlockJeffrey Kery Haltiwanger PA-C.  Electronically Signed: Vista Minkobert Ross, ED Scribe. 05/24/17. 2:20 PM.   History   Chief Complaint Chief Complaint  Patient presents with  . Chest Pain  . Shortness of Breath    HPI HPI Comments: Peter Key is a 26 y.o. male who presents to the Emergency Department complaining of sharp left sided, pleuritic chest pain that started last night that worsened this morning, improved now. Upon arrival, pt's pain was 8/10 but currently he states his pain has reduced to a 5/10 since waiting in the exam room. He also states that he is no longer feeling short of breath. No recent heavy lifting or new exertional activities. No cough, fever. Pt is a smoker. No hormones, recent surgeries, or swelling in lower extremities. Denies any significant risk factors for DVT PE, other than smoking no significant ACS risk factors.  No infectious etiology. Not worse with inspiration. Pt also notes significant anxiety recently.   The history is provided by the patient. No language interpreter was used.    Past Medical History:  Diagnosis Date  . Anxiety     There are no active problems to display for this patient.   History reviewed. No pertinent surgical history.     Home Medications    Prior to Admission medications   Not on File    Family History No family history on file.  Social History Social History  Substance Use Topics  . Smoking status: Current Every Day Smoker  . Smokeless tobacco: Current User  . Alcohol use Yes     Allergies   Patient has no allergy information on record.   Review of Systems Review of Systems  Constitutional: Negative for chills and fever.  Respiratory: Positive for shortness of breath (resolved). Negative  for cough and chest tightness.   Cardiovascular: Positive for chest pain (improving). Negative for leg swelling.     Physical Exam Updated Vital Signs BP (!) 132/94   Pulse 76   Temp 98.3 F (36.8 C)   Resp 13   Ht 5\' 6"  (1.676 m)   Wt 82.6 kg (182 lb)   SpO2 97%   BMI 29.38 kg/m   Physical Exam  Constitutional: He is oriented to person, place, and time. He appears well-developed and well-nourished. No distress.  HENT:  Head: Normocephalic and atraumatic.  Neck: Normal range of motion.  Cardiovascular: Normal rate, regular rhythm, normal heart sounds and intact distal pulses.  Exam reveals no gallop and no friction rub.   No murmur heard. Pulmonary/Chest: Effort normal and breath sounds normal. No respiratory distress. He has no wheezes. He has no rales. He exhibits no tenderness.  Minor TTP of left anterior chest wall  Musculoskeletal: He exhibits no edema.  Neurological: He is alert and oriented to person, place, and time.  Skin: Skin is warm and dry. He is not diaphoretic.  Psychiatric: He has a normal mood and affect. Judgment normal.  Nursing note and vitals reviewed.  ED Treatments / Results  DIAGNOSTIC STUDIES: Oxygen Saturation is 100% on RA, normal by my interpretation.  COORDINATION OF CARE: 2:14 PM-Discussed treatment plan with pt at bedside and pt agreed to plan.   Labs (all labs ordered are listed, but only abnormal results are displayed) Labs Reviewed  BASIC  METABOLIC PANEL  CBC  I-STAT TROPONIN, ED    EKG  EKG Interpretation None       Radiology Dg Chest 2 View  Result Date: 05/24/2017 CLINICAL DATA:  Left chest pain, shortness of breath. EXAM: CHEST  2 VIEW COMPARISON:  None. FINDINGS: The cardiomediastinal silhouette is normal in size. Normal pulmonary vascularity. No focal consolidation, pleural effusion, or pneumothorax. No acute osseous abnormality. IMPRESSION: Normal chest x-ray. Electronically Signed   By: Obie DredgeWilliam T Derry M.D.   On:  05/24/2017 11:39    Procedures Procedures (including critical care time)  Medications Ordered in ED Medications - No data to display   Initial Impression / Assessment and Plan / ED Course  I have reviewed the triage vital signs and the nursing notes.  Pertinent labs & imaging results that were available during my care of the patient were reviewed by me and considered in my medical decision making (see chart for details).     26 year old male presents today with complaints of chest pain.  I have very low suspicion for ACS in this otherwise healthy young male with reassuring workup here.  Patient is PERC negative.  When entering the room he becomes anxious and slightly tachycardic, after talking to him his heart rate returns to the 60s and 70s and maintains there with 100% oxygen saturation.  I have very low suspicion for PE in this patient.  Patient has no signs of infectious etiology.  He does have minimal tenderness palpation of chest wall.  Question musculoskeletal chest pain.  Patient will follow-up as an outpatient with cardiology, he is instructed to return immediately if any new or worsening signs or symptoms present.  Patient will use ibuprofen as needed for discomfort.  He verbalized understanding and agreement to today's plan had no further questions or concerns the time discharge  Final Clinical Impressions(s) / ED Diagnoses   Final diagnoses:  Chest wall pain    New Prescriptions There are no discharge medications for this patient.  I personally performed the services described in this documentation, which was scribed in my presence. The recorded information has been reviewed and is accurate.   Eyvonne MechanicHedges, Rubena Roseman, PA-C 05/24/17 1530    Raeford RazorKohut, Stephen, MD 05/29/17 [phone]

## 2017-05-24 NOTE — ED Triage Notes (Signed)
Pt. Stated, last night I started having SOB with some chest pain , this happened before and they said it was anxiety.

## 2017-05-24 NOTE — Discharge Instructions (Signed)
Please read attached information. If you experience any new or worsening signs or symptoms please return to the emergency room for evaluation. Please follow-up with your primary care provider or specialist as discussed.  °

## 2017-06-03 ENCOUNTER — Encounter (HOSPITAL_BASED_OUTPATIENT_CLINIC_OR_DEPARTMENT_OTHER): Payer: Self-pay | Admitting: *Deleted

## 2017-09-12 ENCOUNTER — Emergency Department (HOSPITAL_COMMUNITY): Payer: Self-pay

## 2017-09-12 ENCOUNTER — Encounter (HOSPITAL_COMMUNITY): Payer: Self-pay | Admitting: Emergency Medicine

## 2017-09-12 ENCOUNTER — Other Ambulatory Visit: Payer: Self-pay

## 2017-09-12 ENCOUNTER — Emergency Department (HOSPITAL_COMMUNITY)
Admission: EM | Admit: 2017-09-12 | Discharge: 2017-09-12 | Disposition: A | Discharge status: Home / Self Care | Payer: Self-pay | Attending: Emergency Medicine | Admitting: Emergency Medicine

## 2017-09-12 DIAGNOSIS — R0789 Other chest pain: Secondary | ICD-10-CM

## 2017-09-12 DIAGNOSIS — R0981 Nasal congestion: Secondary | ICD-10-CM | POA: Insufficient documentation

## 2017-09-12 DIAGNOSIS — F17210 Nicotine dependence, cigarettes, uncomplicated: Secondary | ICD-10-CM | POA: Insufficient documentation

## 2017-09-12 DIAGNOSIS — B9689 Other specified bacterial agents as the cause of diseases classified elsewhere: Secondary | ICD-10-CM | POA: Insufficient documentation

## 2017-09-12 DIAGNOSIS — B9789 Other viral agents as the cause of diseases classified elsewhere: Secondary | ICD-10-CM

## 2017-09-12 DIAGNOSIS — J069 Acute upper respiratory infection, unspecified: Secondary | ICD-10-CM | POA: Insufficient documentation

## 2017-09-12 DIAGNOSIS — R05 Cough: Secondary | ICD-10-CM | POA: Insufficient documentation

## 2017-09-12 DIAGNOSIS — R062 Wheezing: Secondary | ICD-10-CM | POA: Insufficient documentation

## 2017-09-12 LAB — CBC
HEMATOCRIT: 46.7 % (ref 39.0–52.0)
Hemoglobin: 16.1 g/dL (ref 13.0–17.0)
MCH: 30.1 pg (ref 26.0–34.0)
MCHC: 34.5 g/dL (ref 30.0–36.0)
MCV: 87.5 fL (ref 78.0–100.0)
Platelets: 255 10*3/uL (ref 150–400)
RBC: 5.34 MIL/uL (ref 4.22–5.81)
RDW: 13.5 % (ref 11.5–15.5)
WBC: 7.5 10*3/uL (ref 4.0–10.5)

## 2017-09-12 LAB — BASIC METABOLIC PANEL
Anion gap: 4 — ABNORMAL LOW (ref 5–15)
BUN: 11 mg/dL (ref 6–20)
CALCIUM: 9 mg/dL (ref 8.9–10.3)
CO2: 28 mmol/L (ref 22–32)
CREATININE: 1.09 mg/dL (ref 0.61–1.24)
Chloride: 106 mmol/L (ref 101–111)
GFR calc Af Amer: >60 mL/min (ref >60)
GLUCOSE: 116 mg/dL — AB (ref 65–99)
POTASSIUM: 4.9 mmol/L (ref 3.5–5.1)
Sodium: 138 mmol/L (ref 135–145)

## 2017-09-12 LAB — I-STAT TROPONIN, ED: Troponin i, poc: 0 ng/mL (ref 0.00–0.08)

## 2017-09-12 MED ORDER — AEROCHAMBER PLUS FLO-VU LARGE MISC
1.0000 | Freq: Once | Status: DC
Start: 2017-09-12 — End: 2017-09-12

## 2017-09-12 MED ORDER — ALBUTEROL SULFATE (2.5 MG/3ML) 0.083% IN NEBU
5.0000 mg | INHALATION_SOLUTION | Freq: Once | RESPIRATORY_TRACT | Status: AC
  Administered 2017-09-12: 5 mg via RESPIRATORY_TRACT
  Filled 2017-09-12: qty 6

## 2017-09-12 MED ORDER — GUAIFENESIN ER 1200 MG PO TB12: 1.0000 | ORAL_TABLET | Freq: Two times a day (BID) | ORAL | 0 refills | Status: DC

## 2017-09-12 MED ORDER — ALBUTEROL SULFATE HFA 108 (90 BASE) MCG/ACT IN AERS
2.0000 | INHALATION_SPRAY | Freq: Four times a day (QID) | RESPIRATORY_TRACT | Status: DC
  Administered 2017-09-12: 2 via RESPIRATORY_TRACT
  Filled 2017-09-12: qty 6.7

## 2017-09-12 MED ORDER — IBUPROFEN 800 MG PO TABS: 800.0000 mg | ORAL_TABLET | Freq: Three times a day (TID) | ORAL | 0 refills | Status: DC | PRN

## 2017-09-12 MED ORDER — PROMETHAZINE-DM 6.25-15 MG/5ML PO SYRP: 5.0000 mL | ORAL_SOLUTION | Freq: Four times a day (QID) | ORAL | 0 refills | Status: DC | PRN

## 2017-09-12 NOTE — ED Triage Notes (Signed)
Pt reports waking up today w/ centralized chest tightness that moves to both arms.  Pt has been "sick" w/ a cough and congestion for over a week.  No respiratory distress or wheezing noted.  Pt denies n/v/dizziness or loc

## 2017-09-12 NOTE — Discharge Instructions (Signed)
Your testing here today did not show any abnormalities.  Follow-up with a primary doctor.  Increase your fluid intake and rest as much as possible.

## 2017-09-13 NOTE — ED Provider Notes (Signed)
[facility] Provider [facility]   CSN: [id number] Arrival date & time: 09/12/17  [id number]     History   Chief Complaint Chief Complaint  Patient presents with  . Shortness of Breath  . Chest Pain    HPI Peter AlstromHassan S Key is a 26 y.o. male.  HPI  Patient presents to the emergency department with cough with nasal congestion with chest soreness and at times wheezing.  The patient states he was never really short of breath.  The patient states that nothing seemed to make the condition better or worse.  Patient did not take any medications prior to arrival for symptoms.  Patient states that he did not take any over the counters prior to arrival.  The patient denies  shortness of breath, headache,blurred vision, neck pain, fever, cough, weakness, numbness, dizziness, anorexia, edema, abdominal pain, nausea, vomiting, diarrhea, rash, back pain, dysuria, hematemesis, bloody stool, near syncope, or syncope. Past Medical History:  Diagnosis Date  . Anxiety     There are no active problems to display for this patient.   Past Surgical History:  Procedure Laterality Date  . TONSILLECTOMY         Home Medications    Prior to Admission medications   Medication Sig Start Date End Date Taking? Authorizing Provider  benzonatate (TESSALON) 100 MG capsule Take 1 capsule (100 mg total) by mouth every 8 (eight) hours. Patient not taking: Reported on 09/12/2017 10/21/13   [facility], Joshua, PA-C  Guaifenesin 1200 MG TB12 Take 1 tablet (1,200 mg total) 2 (two) times daily by mouth. 09/12/17   Peter Key, Peter Deerhristopher, PA-C  ibuprofen (ADVIL,MOTRIN) 800 MG tablet Take 1 tablet (800 mg total) every 8 (eight) hours as needed by mouth. 09/12/17   Peter Key, Peter Deerhristopher, PA-C  promethazine-dextromethorphan (PROMETHAZINE-DM) 6.25-15 MG/5ML syrup Take 5 mLs 4 (four) times daily as needed by mouth for cough. 09/12/17   Charlestine NightLawyer, Peter Mangel, PA-C    Family History No family history  on file.  Social History Social History   Tobacco Use  . Smoking status: Current Every Day Smoker    Packs/day: 0.25    Types: Cigarettes  . Smokeless tobacco: Current User  Substance Use Topics  . Alcohol use: Yes    Comment: occ  . Drug use: No     Allergies   Patient has no known allergies.   Review of Systems Review of Systems All other systems negative except as documented in the HPI. All pertinent positives and negatives as reviewed in the HPI. Physical Exam Updated Vital Signs BP (!) 121/95   Pulse 94   Temp 98.9 F (37.2 C)   Resp 15   Ht 5\' 6"  (1.676 m)   Wt 86.2 kg (190 lb)   SpO2 100%   BMI 30.67 kg/m   Physical Exam  Constitutional: He is oriented to person, place, and time. He appears well-developed and well-nourished. No distress.  HENT:  Head: Normocephalic and atraumatic.  Mouth/Throat: Oropharynx is clear and moist.  Eyes: Pupils are equal, round, and reactive to light.  Neck: Normal range of motion. Neck supple.  Cardiovascular: Normal rate, regular rhythm and normal heart sounds. Exam reveals no gallop and no friction rub.  No murmur heard. Pulmonary/Chest: Effort normal and breath sounds normal. No respiratory distress. He has no decreased breath sounds. He has no wheezes. He has no rhonchi. He has no rales.  Neurological: He is alert and oriented to person, place, and time. He exhibits normal muscle tone. Coordination  normal.  Skin: Skin is warm and dry. Capillary refill takes less than 2 seconds. No rash noted. No erythema.  Psychiatric: He has a normal mood and affect. His behavior is normal.  Nursing note and vitals reviewed.    ED Treatments / Results  Labs (all labs ordered are listed, but only abnormal results are displayed) Labs Reviewed  BASIC METABOLIC PANEL - Abnormal; Notable for the following components:      Result Value   Glucose, Bld 116 (*)    Anion gap 4 (*)    All other components within normal limits  CBC  I-STAT  TROPONIN, ED    EKG  EKG Interpretation  Date/Time:  Monday September 12 2017 04:36:38 EST Ventricular Rate:  87 PR Interval:  138 QRS Duration: 92 QT Interval:  338 QTC Calculation: 406 R Axis:   86 Text Interpretation:  Normal sinus rhythm Possible Anterior infarct , age undetermined Abnormal ECG No acute changes No significant change since last tracing Confirmed by Derwood KaplanNanavati, Ankit [phone]([id number]) on 09/12/2017 12:53:07 PM       Radiology Dg Chest 2 View  Result Date: 09/12/2017 CLINICAL DATA:  Chest tightness, cough, and bilateral arm numbness for 1 week. EXAM: CHEST  2 VIEW COMPARISON:  11/08/2016 FINDINGS: The heart size and mediastinal contours are within normal limits. Both lungs are clear. The visualized skeletal structures are unremarkable. IMPRESSION: No active cardiopulmonary disease. Electronically Signed   By: Burman NievesWilliam  Key M.D.   On: 09/12/2017 05:42    Procedures Procedures (including critical care time)  Medications Ordered in ED Medications  albuterol (PROVENTIL) (2.5 MG/3ML) 0.083% nebulizer solution 5 mg (5 mg Nebulization Given 09/12/17 1150)     Initial Impression / Assessment and Plan / ED Course  I have reviewed the triage vital signs and the nursing notes.  Pertinent labs & imaging results that were available during my care of the patient were reviewed by me and considered in my medical decision making (see chart for details).     Patient will be treated for upper respiratory infection told to return here as needed he was given a breathing treatment and felt better after this.  Patient will be advised to return here as needed.  Told to increase his fluid intake and rest as much as possible.  Final Clinical Impressions(s) / ED Diagnoses   Final diagnoses:  Chest wall pain  Viral URI with cough    ED Discharge Orders        Ordered    promethazine-dextromethorphan (PROMETHAZINE-DM) 6.25-15 MG/5ML syrup  4 times daily PRN     09/12/17 1249     ibuprofen (ADVIL,MOTRIN) 800 MG tablet  Every 8 hours PRN     09/12/17 1249    Guaifenesin 1200 MG TB12  2 times daily     09/12/17 1249       Charlestine NightLawyer, Jovon Winterhalter, PA-C 09/13/17 1527    Derwood KaplanNanavati, Ankit, MD 09/14/17 2034

## 2017-10-05 ENCOUNTER — Other Ambulatory Visit: Payer: Self-pay

## 2017-10-05 ENCOUNTER — Encounter (HOSPITAL_COMMUNITY): Payer: Self-pay

## 2017-10-05 ENCOUNTER — Emergency Department (HOSPITAL_COMMUNITY): Payer: Self-pay

## 2017-10-05 DIAGNOSIS — Z5321 Procedure and treatment not carried out due to patient leaving prior to being seen by health care provider: Secondary | ICD-10-CM | POA: Insufficient documentation

## 2017-10-05 DIAGNOSIS — R0789 Other chest pain: Secondary | ICD-10-CM | POA: Insufficient documentation

## 2017-10-05 LAB — CBC
HEMATOCRIT: 46.5 % (ref 39.0–52.0)
HEMOGLOBIN: 16.3 g/dL (ref 13.0–17.0)
MCH: 29.7 pg (ref 26.0–34.0)
MCHC: 35.1 g/dL (ref 30.0–36.0)
MCV: 84.7 fL (ref 78.0–100.0)
Platelets: 231 10*3/uL (ref 150–400)
RBC: 5.49 MIL/uL (ref 4.22–5.81)
RDW: 13 % (ref 11.5–15.5)
WBC: 7.2 10*3/uL (ref 4.0–10.5)

## 2017-10-05 LAB — I-STAT TROPONIN, ED: Troponin i, poc: 0 ng/mL (ref 0.00–0.08)

## 2017-10-05 LAB — BASIC METABOLIC PANEL
ANION GAP: 7 (ref 5–15)
BUN: 11 mg/dL (ref 6–20)
CHLORIDE: 101 mmol/L (ref 101–111)
CO2: 26 mmol/L (ref 22–32)
Calcium: 9.5 mg/dL (ref 8.9–10.3)
Creatinine, Ser: 1.11 mg/dL (ref 0.61–1.24)
GFR calc non Af Amer: >60 mL/min (ref >60)
GLUCOSE: 90 mg/dL (ref 65–99)
POTASSIUM: 3.9 mmol/L (ref 3.5–5.1)
Sodium: 134 mmol/L — ABNORMAL LOW (ref 135–145)

## 2017-10-05 NOTE — ED Triage Notes (Signed)
PT reports central chest pain on and off x 2 weeks. PT states pain is worse at night when laying down. Pt endorses nausea with pain. HE reports having hx of GERD and this feels similar.

## 2017-10-06 ENCOUNTER — Other Ambulatory Visit: Payer: Self-pay

## 2017-10-06 ENCOUNTER — Encounter (HOSPITAL_COMMUNITY): Payer: Self-pay | Admitting: Urgent Care

## 2017-10-06 ENCOUNTER — Emergency Department (HOSPITAL_COMMUNITY)
Admission: EM | Admit: 2017-10-06 | Discharge: 2017-10-06 | Disposition: A | Discharge status: Home / Self Care | Payer: Self-pay | Attending: Emergency Medicine | Admitting: Emergency Medicine

## 2017-10-06 ENCOUNTER — Ambulatory Visit (HOSPITAL_COMMUNITY)
Admission: EM | Admit: 2017-10-06 | Discharge: 2017-10-06 | Disposition: A | Discharge status: Home / Self Care | Payer: Self-pay | Attending: Urgent Care | Admitting: Urgent Care

## 2017-10-06 DIAGNOSIS — R0789 Other chest pain: Secondary | ICD-10-CM

## 2017-10-06 DIAGNOSIS — K219 Gastro-esophageal reflux disease without esophagitis: Secondary | ICD-10-CM

## 2017-10-06 DIAGNOSIS — R109 Unspecified abdominal pain: Secondary | ICD-10-CM

## 2017-10-06 LAB — POCT H PYLORI SCREEN: H. PYLORI SCREEN, POC: NEGATIVE

## 2017-10-06 MED ORDER — OMEPRAZOLE 20 MG PO CPDR: 20.0000 mg | DELAYED_RELEASE_CAPSULE | Freq: Every day | ORAL | 1 refills | Status: DC

## 2017-10-06 MED ORDER — RANITIDINE HCL 150 MG PO TABS: 150.0000 mg | ORAL_TABLET | Freq: Two times a day (BID) | ORAL | 0 refills | Status: DC

## 2017-10-06 NOTE — ED Provider Notes (Signed)
  MRN: [id number] DOB: May 31, 1991  Subjective:   Peter Key is a 26 y.o. male presenting for 3 week history of mid-sternal chest pain associated with shob, heart racing, shob. Pain can be worse when he lays down. Has been having acid reflux, sour brash in his throat. Eats fried foods but has been trying to avoid reflux causing foods with relief. Also has mild intermittent cough, occasional headache, intermittent nausea without vomiting, mild occasional lower belly pain, gassiness, burping. Has trouble with constipation, reports bowel movement daily but has to strain, has hard stools. Has tried Tums with relief of his symptoms as well. Smoked 1.5 packs per week, quit smoking 3 weeks ago.   Peter Key is not currently taking any medications and has No Known Allergies.  Peter Key  has a past medical history of Anxiety. Also  has a past surgical history that includes Tonsillectomy. Family history is positive for breast cancer in maternal aunt, high blood pressure and clot disorder in maternal aunt, maternal grandfather has diabetes.   Objective:   Vitals: BP 122/78 (BP Location: Left Arm)   Pulse 94   Temp 98.6 F (37 C) (Oral)   SpO2 99%   Physical Exam  Constitutional: He is oriented to person, place, and time. He appears well-developed and well-nourished.  HENT:  Mouth/Throat: Oropharynx is clear and moist.  Eyes: Right eye exhibits no discharge. Left eye exhibits no discharge. No scleral icterus.  Neck: Normal range of motion. Neck supple. No thyromegaly present.  Cardiovascular: Normal rate, regular rhythm and intact distal pulses. Exam reveals no gallop and no friction rub.  No murmur heard. Pulmonary/Chest: No respiratory distress. He has no wheezes. He has no rales.  Abdominal: Soft. Bowel sounds are normal. He exhibits no distension and no mass. There is tenderness (lower abdomen). There is no guarding.  Musculoskeletal: He exhibits no edema.  Neurological: He is alert and oriented to  person, place, and time.  Skin: Skin is warm and dry.  Psychiatric:  Anxious demeanor.   Results for orders placed or performed during the hospital encounter of 10/06/17 (from the past 24 hour(s))  H.pylori screen, POC     Status: None   Collection Time: 10/06/17  5:17 PM  Result Value Ref Range   H. PYLORI SCREEN, POC NEGATIVE NEGATIVE   ED ECG REPORT   Date: 10/06/2017  Rate: 95bpm  Rhythm: normal sinus rhythm  QRS Axis: normal  Intervals: normal  ST/T Wave abnormalities: normal  Conduction Disutrbances:none  Narrative Interpretation: normal sinus rhythm, very comparable to ecg from 10/05/2017  Old EKG Reviewed: unchanged  I have personally reviewed the EKG tracing and agree with the computerized printout as noted.  Assessment and Plan :   Gastroesophageal reflux disease, esophagitis presence not specified  Atypical chest pain  Belly pain   Labs, chest x-ray, ecg are very reassuring from yesterday's ER visit. H. Pylori test is negative. I will have patient start Zantac with Prilosec, he was provided with instructions on weaning off of Zantac. He is also to make lifestyle modifications for GERD and what I suspect is constipation related belly pain. Recommended he look to establish care with a PCP for management of anxiety. Return-to-clinic precautions discussed, patient verbalized understanding.   Wallis BambergMario Izabell Schalk, PA-C [facility]  10/06/2017  5:12 PM    Wallis BambergMani, Beckam Abdulaziz, PA-C 10/06/17 1734

## 2017-10-06 NOTE — ED Triage Notes (Signed)
Per pt he is having chest pains right now, per pt chest pain for 3 wks , sob, feels like acid when he eat, per pt he stopped eating grease food,

## 2017-10-06 NOTE — ED Notes (Signed)
Unable to locate pt in the waiting area.

## 2017-12-02 ENCOUNTER — Emergency Department (HOSPITAL_BASED_OUTPATIENT_CLINIC_OR_DEPARTMENT_OTHER)
Admission: EM | Admit: 2017-12-02 | Discharge: 2017-12-03 | Disposition: A | Discharge status: Home / Self Care | Payer: Self-pay | Attending: Emergency Medicine | Admitting: Emergency Medicine

## 2017-12-02 ENCOUNTER — Other Ambulatory Visit: Payer: Self-pay

## 2017-12-02 ENCOUNTER — Encounter (HOSPITAL_BASED_OUTPATIENT_CLINIC_OR_DEPARTMENT_OTHER): Payer: Self-pay | Admitting: *Deleted

## 2017-12-02 DIAGNOSIS — J069 Acute upper respiratory infection, unspecified: Secondary | ICD-10-CM | POA: Insufficient documentation

## 2017-12-02 DIAGNOSIS — B9789 Other viral agents as the cause of diseases classified elsewhere: Secondary | ICD-10-CM

## 2017-12-02 DIAGNOSIS — Z87891 Personal history of nicotine dependence: Secondary | ICD-10-CM | POA: Insufficient documentation

## 2017-12-02 DIAGNOSIS — J029 Acute pharyngitis, unspecified: Secondary | ICD-10-CM

## 2017-12-02 LAB — RAPID STREP SCREEN (MED CTR MEBANE ONLY): Streptococcus, Group A Screen (Direct): NEGATIVE

## 2017-12-02 NOTE — ED Triage Notes (Signed)
He thinks he has the flu. He has not taken any fever reducers or pain relief medicines. Sore throat since last night.

## 2017-12-03 MED ORDER — BENZONATATE 100 MG PO CAPS: 100.0000 mg | ORAL_CAPSULE | Freq: Three times a day (TID) | ORAL | 0 refills | Status: DC

## 2017-12-03 NOTE — ED Notes (Signed)
Pt discharged to home with family. NAD.  

## 2017-12-03 NOTE — ED Provider Notes (Signed)
[facility] EMERGENCY DEPARTMENT Provider Note   CSN: [id number] Arrival date & time: 12/02/17  1918     History   Chief Complaint Chief Complaint  Patient presents with  . Sore Throat    HPI Peter Key is a 27 y.o. male with no significant past medical history presenting with 24 hours of sore throat, cough, chest congestion and shortness of breath.  Known ill contacts with partner with similar symptoms.  Denies fever, chills, nausea, vomiting, diarrhea, abdominal pain, myalgias. Patient has not tried anything for his symptoms. Denies receiving flu shot this year.  HPI  Past Medical History:  Diagnosis Date  . Anxiety     There are no active problems to display for this patient.   Past Surgical History:  Procedure Laterality Date  . TONSILLECTOMY         Home Medications    Prior to Admission medications   Medication Sig Start Date End Date Taking? Authorizing Provider  ranitidine (ZANTAC) 150 MG tablet Take 1 tablet (150 mg total) by mouth 2 (two) times daily. 10/06/17  Yes Wallis Bamberg, PA-C  albuterol (PROVENTIL HFA) 108 (90 Base) MCG/ACT inhaler Inhale 2 puffs into the lungs every 6 (six) hours as needed for wheezing or shortness of breath.    [provider]  benzonatate (TESSALON) 100 MG capsule Take 1 capsule (100 mg total) by mouth every 8 (eight) hours. 12/03/17   Mathews Robinsons B, PA-C  calcium carbonate (TUMS - DOSED IN MG ELEMENTAL CALCIUM) 500 MG chewable tablet Chew 1 tablet by mouth 3 (three) times daily as needed for indigestion or heartburn.    [provider]  Guaifenesin 1200 MG TB12 Take 1 tablet (1,200 mg total) 2 (two) times daily by mouth. Patient not taking: Reported on 10/05/2017 09/12/17   Charlestine Night, PA-C  ibuprofen (ADVIL,MOTRIN) 800 MG tablet Take 1 tablet (800 mg total) every 8 (eight) hours as needed by mouth. Patient not taking: Reported on 10/05/2017 09/12/17   Charlestine Night, PA-C    omeprazole (PRILOSEC) 20 MG capsule Take 1 capsule (20 mg total) by mouth daily. 10/06/17   Wallis Bamberg, PA-C  promethazine-dextromethorphan (PROMETHAZINE-DM) 6.25-15 MG/5ML syrup Take 5 mLs 4 (four) times daily as needed by mouth for cough. Patient not taking: Reported on 10/05/2017 09/12/17   Charlestine Night, PA-C    Family History No family history on file.  Social History Social History   Tobacco Use  . Smoking status: Former Smoker    Packs/day: 0.25    Types: Cigarettes  . Smokeless tobacco: Former Neurosurgeon  . Tobacco comment: per pt he stopped 3 wks ago from 10-06-2017  Substance Use Topics  . Alcohol use: No    Frequency: Never    Comment: occ, per pt he stopped 3 wks ago from 10-06-2017  . Drug use: No     Allergies   Patient has no known allergies.   Review of Systems Review of Systems  Constitutional: Negative for chills, diaphoresis and fever.  HENT: Positive for congestion and sore throat. Negative for ear pain, facial swelling, sinus pressure, sinus pain, tinnitus, trouble swallowing and voice change.   Eyes: Negative for pain and visual disturbance.  Respiratory: Positive for cough and shortness of breath. Negative for choking, wheezing and stridor.   Cardiovascular: Negative for chest pain, palpitations and leg swelling.  Gastrointestinal: Negative for abdominal pain, nausea and vomiting.  Genitourinary: Negative for difficulty urinating, dysuria, flank pain and hematuria.  Musculoskeletal: Negative for arthralgias, back  pain, gait problem, joint swelling, myalgias, neck pain and neck stiffness.  Skin: Negative for color change, pallor and rash.  Neurological: Negative for dizziness, seizures, syncope, light-headedness and headaches.     Physical Exam Updated Vital Signs BP 118/80 (BP Location: Right Arm)   Pulse 88   Temp 98 F (36.7 C)   Resp 20   Ht 5\' 6"  (1.676 m)   Wt 79.4 kg (175 lb)   SpO2 100%   BMI 28.25 kg/m   Physical Exam   Constitutional: He appears well-developed and well-nourished.  Non-toxic appearance. He does not appear ill. No distress.  Afebrile, nontoxic-appearing, sleeping comfortably in bed no acute distress.  HENT:  Head: Normocephalic and atraumatic.  Right Ear: Tympanic membrane and ear canal normal. No drainage.  Left Ear: Tympanic membrane and ear canal normal. No drainage.  Mouth/Throat: Uvula is midline, oropharynx is clear and moist and mucous membranes are normal. Mucous membranes are not pale and not dry. No oral lesions. No uvula swelling. No oropharyngeal exudate, posterior oropharyngeal edema, posterior oropharyngeal erythema or tonsillar abscesses. No tonsillar exudate.  Eyes: Conjunctivae are normal.  Neck: Normal range of motion. Neck supple.  Cardiovascular: Normal rate and regular rhythm.  No murmur heard. Pulmonary/Chest: Effort normal and breath sounds normal. No stridor. No respiratory distress. He has no wheezes. He has no rhonchi. He has no rales. He exhibits no tenderness.  Abdominal: He exhibits no distension.  Musculoskeletal: He exhibits no edema.  Lymphadenopathy:    He has cervical adenopathy.  Neurological: He is alert.  Skin: Skin is warm and dry. No rash noted. He is not diaphoretic. No erythema. No pallor.  Psychiatric: He has a normal mood and affect.  Nursing note and vitals reviewed.    ED Treatments / Results  Labs (all labs ordered are listed, but only abnormal results are displayed) Labs Reviewed  RAPID STREP SCREEN (NOT AT [facility])  CULTURE, GROUP A STREP [facility])    EKG  EKG Interpretation None       Radiology No results found.  Procedures Procedures (including critical care time)  Medications Ordered in ED Medications - No data to display   Initial Impression / Assessment and Plan / ED Course  I have reviewed the triage vital signs and the nursing notes.  Pertinent labs & imaging results that were available during my care of the patient  were reviewed by me and considered in my medical decision making (see chart for details).    Pt presents afebrile without tonsillar exudate, negative strep. mild cervical lymphadenopathy, & odynophagia; diagnosis of viral pharyngitis. No abx indicated. DC w symptomatic tx for pain.  Pt does not appear dehydrated, but did discuss importance of water rehydration. Presentation non concerning for PTA or infxn spread to soft tissue. No trismus or uvula deviation. Specific return precautions discussed. Pt able to drink water in ED without difficulty with intact air way. Recommended PCP follow up.  Discharge home with symptomatic relief and close follow-up with PCP.  Discussed strict return precautions and advised to return to the emergency department if experiencing any new or worsening symptoms. Instructions were understood and patient agreed with discharge plan.  Final Clinical Impressions(s) / ED Diagnoses   Final diagnoses:  Viral pharyngitis  Viral URI with cough    ED Discharge Orders        Ordered    benzonatate (TESSALON) 100 MG capsule  Every 8 hours     12/03/17 0018  Georgiana Shore, PA-C 12/03/17 Waunita Schooner    Geoffery Lyons, MD 12/03/17 [id number]

## 2017-12-03 NOTE — Discharge Instructions (Signed)
As discussed, use tea with honey, cough drops, over-the-counter throat sprays to help soothe your throat. Cough medication as needed.  Stay well-hydrated.  Follow-up with your primary care provider.  If symptoms worsen, chest pain, difficulty breathing, vomiting, or other new concerning symptoms in the meantime.

## 2017-12-06 LAB — CULTURE, GROUP A STREP (THRC)

## 2018-01-05 ENCOUNTER — Emergency Department (HOSPITAL_BASED_OUTPATIENT_CLINIC_OR_DEPARTMENT_OTHER)
Admission: EM | Admit: 2018-01-05 | Discharge: 2018-01-06 | Disposition: A | Discharge status: Home / Self Care | Payer: Self-pay | Attending: Emergency Medicine | Admitting: Emergency Medicine

## 2018-01-05 ENCOUNTER — Encounter (HOSPITAL_BASED_OUTPATIENT_CLINIC_OR_DEPARTMENT_OTHER): Payer: Self-pay

## 2018-01-05 ENCOUNTER — Emergency Department (HOSPITAL_BASED_OUTPATIENT_CLINIC_OR_DEPARTMENT_OTHER): Payer: Self-pay

## 2018-01-05 ENCOUNTER — Other Ambulatory Visit: Payer: Self-pay

## 2018-01-05 DIAGNOSIS — Z87891 Personal history of nicotine dependence: Secondary | ICD-10-CM | POA: Insufficient documentation

## 2018-01-05 DIAGNOSIS — J111 Influenza due to unidentified influenza virus with other respiratory manifestations: Secondary | ICD-10-CM | POA: Insufficient documentation

## 2018-01-05 DIAGNOSIS — R69 Illness, unspecified: Secondary | ICD-10-CM

## 2018-01-05 DIAGNOSIS — Z79899 Other long term (current) drug therapy: Secondary | ICD-10-CM | POA: Insufficient documentation

## 2018-01-05 MED ORDER — OSELTAMIVIR PHOSPHATE 75 MG PO CAPS: 75.0000 mg | ORAL_CAPSULE | Freq: Two times a day (BID) | ORAL | 0 refills | Status: DC

## 2018-01-05 MED ORDER — ACETAMINOPHEN 325 MG PO TABS
650.0000 mg | ORAL_TABLET | Freq: Once | ORAL | Status: AC
  Administered 2018-01-05: 650 mg via ORAL
  Filled 2018-01-05: qty 2

## 2018-01-05 NOTE — ED Provider Notes (Signed)
[facility] EMERGENCY DEPARTMENT Provider Note   CSN: [id number] Arrival date & time: 01/05/18  1946     History   Chief Complaint Chief Complaint  Patient presents with  . Cough    HPI Peter Key is a 27 y.o. male.  The history is provided by the patient.  He had onset yesterday sore throat, cough productive of clear sputum which is occasionally blood streaked.  He is complaining of some aching in his chest.  He has not noticed any fever or chills and there is been no nausea or vomiting.  He denies any sick contacts.  He denies dyspnea.  He did not receive the influenza immunization this season.  Past Medical History:  Diagnosis Date  . Anxiety     There are no active problems to display for this patient.   Past Surgical History:  Procedure Laterality Date  . TONSILLECTOMY         Home Medications    Prior to Admission medications   Medication Sig Start Date End Date Taking? Authorizing Provider  albuterol (PROVENTIL HFA) 108 (90 Base) MCG/ACT inhaler Inhale 2 puffs into the lungs every 6 (six) hours as needed for wheezing or shortness of breath.    [provider]  benzonatate (TESSALON) 100 MG capsule Take 1 capsule (100 mg total) by mouth every 8 (eight) hours. 12/03/17   Mathews Robinsons B, PA-C  calcium carbonate (TUMS - DOSED IN MG ELEMENTAL CALCIUM) 500 MG chewable tablet Chew 1 tablet by mouth 3 (three) times daily as needed for indigestion or heartburn.    [provider]  Guaifenesin 1200 MG TB12 Take 1 tablet (1,200 mg total) 2 (two) times daily by mouth. Patient not taking: Reported on 10/05/2017 09/12/17   Charlestine Night, PA-C  ibuprofen (ADVIL,MOTRIN) 800 MG tablet Take 1 tablet (800 mg total) every 8 (eight) hours as needed by mouth. Patient not taking: Reported on 10/05/2017 09/12/17   Charlestine Night, PA-C  omeprazole (PRILOSEC) 20 MG capsule Take 1 capsule (20 mg total) by mouth daily. 10/06/17   Wallis Bamberg, PA-C  promethazine-dextromethorphan (PROMETHAZINE-DM) 6.25-15 MG/5ML syrup Take 5 mLs 4 (four) times daily as needed by mouth for cough. Patient not taking: Reported on 10/05/2017 09/12/17   Charlestine Night, PA-C  ranitidine (ZANTAC) 150 MG tablet Take 1 tablet (150 mg total) by mouth 2 (two) times daily. 10/06/17   Wallis Bamberg, PA-C    Family History No family history on file.  Social History Social History   Tobacco Use  . Smoking status: Former Smoker    Packs/day: 0.25    Types: Cigarettes  . Smokeless tobacco: Never Used  Substance Use Topics  . Alcohol use: No    Frequency: Never  . Drug use: No     Allergies   Patient has no known allergies.   Review of Systems Review of Systems  All other systems reviewed and are negative.    Physical Exam Updated Vital Signs BP (!) 127/99 (BP Location: Left Arm)   Pulse (!) 102   Temp 98.8 F (37.1 C) (Oral)   Resp 18   Wt 82.3 kg (181 lb 7 oz)   SpO2 100%   BMI 29.28 kg/m   Physical Exam  Nursing note and vitals reviewed.  27 year old male, resting comfortably and in no acute distress. Vital signs are significant for borderline elevated heart rate, and elevated diastolic blood pressure. Oxygen saturation is 100%, which is normal. Head is normocephalic and  atraumatic. PERRLA, EOMI. Oropharynx is clear. Neck is nontender and supple without adenopathy or JVD. Back is nontender and there is no CVA tenderness. Lungs are clear without rales, wheezes, or rhonchi. Chest is nontender. Heart has regular rate and rhythm without murmur. Abdomen is soft, flat, nontender without masses or hepatosplenomegaly and peristalsis is normoactive. Extremities have no cyanosis or edema, full range of motion is present. Skin is warm and dry without rash. Neurologic: Mental status is normal, cranial nerves are intact, there are no motor or sensory deficits.  ED Treatments / Results   Radiology Dg Chest 2 View  Result Date:  01/05/2018 CLINICAL DATA:  Cough EXAM: CHEST - 2 VIEW COMPARISON:  October 05, 2017 FINDINGS: Lungs are clear. Heart size and pulmonary vascularity are normal. No adenopathy. No pneumothorax. No bone lesions. IMPRESSION: No edema or consolidation. Electronically Signed   By: Bretta BangWilliam  Woodruff III M.D.   On: 01/05/2018 20:50    Procedures Procedures   Medications Ordered in ED Medications  acetaminophen (TYLENOL) tablet 650 mg (650 mg Oral Given 01/05/18 2233)     Initial Impression / Assessment and Plan / ED Course  I have reviewed the triage vital signs and the nursing notes.  Pertinent imaging results that were available during my care of the patient were reviewed by me and considered in my medical decision making (see chart for details).  Influenza-like illness.  Chest x-ray is negative for pneumonia.  No red flags to suggest more serious illness.  He is given a prescription for oseltamivir and advised use over-the-counter medications as needed.  Old records are reviewed, and he was seen about 1 month ago for viral pharyngitis.  Final Clinical Impressions(s) / ED Diagnoses   Final diagnoses:  Influenza-like illness    ED Discharge Orders    None       Dione BoozeGlick, Menna Abeln, MD 01/05/18 2347

## 2018-01-05 NOTE — Discharge Instructions (Signed)
Drink plenty of fluids. Take acetaminophen or ibuprofen for fever or aching. Take over-the-counter medications as needed. Look for medicine with DM in the name - they will contain dextromethorphan, which helps suppress coughing.

## 2018-01-05 NOTE — ED Triage Notes (Signed)
C/o flu like sx day 2-states he coughed up blood today-NAD-steady gait

## 2018-01-05 NOTE — ED Notes (Signed)
Pt c/o URI symptoms for the last two days, has been seen several times in the last 6 months for the same.  Has not tried any OTC medications for symptoms.  Sitting on bed and scrolling through social media, no acute distress.

## 2018-01-06 NOTE — ED Notes (Signed)
Pt verbalizes understanding of d/c instructions and denies any further needs at this time. 

## 2018-12-25 ENCOUNTER — Emergency Department (HOSPITAL_BASED_OUTPATIENT_CLINIC_OR_DEPARTMENT_OTHER)
Admission: EM | Admit: 2018-12-25 | Discharge: 2018-12-25 | Disposition: A | Discharge status: Home / Self Care | Payer: Self-pay | Attending: Emergency Medicine | Admitting: Emergency Medicine

## 2018-12-25 ENCOUNTER — Emergency Department (HOSPITAL_BASED_OUTPATIENT_CLINIC_OR_DEPARTMENT_OTHER): Payer: Self-pay

## 2018-12-25 ENCOUNTER — Encounter (HOSPITAL_BASED_OUTPATIENT_CLINIC_OR_DEPARTMENT_OTHER): Payer: Self-pay | Admitting: Emergency Medicine

## 2018-12-25 ENCOUNTER — Other Ambulatory Visit: Payer: Self-pay

## 2018-12-25 DIAGNOSIS — Z79899 Other long term (current) drug therapy: Secondary | ICD-10-CM | POA: Insufficient documentation

## 2018-12-25 DIAGNOSIS — Z87891 Personal history of nicotine dependence: Secondary | ICD-10-CM | POA: Insufficient documentation

## 2018-12-25 DIAGNOSIS — R0789 Other chest pain: Secondary | ICD-10-CM | POA: Insufficient documentation

## 2018-12-25 MED ORDER — IBUPROFEN 800 MG PO TABS
800.0000 mg | ORAL_TABLET | Freq: Once | ORAL | Status: AC
  Administered 2018-12-25: 800 mg via ORAL
  Filled 2018-12-25: qty 1

## 2018-12-25 NOTE — ED Notes (Signed)
ED Provider at bedside. 

## 2018-12-25 NOTE — ED Provider Notes (Signed)
TIME SEEN: 2:15 AM  CHIEF COMPLAINT: Chest pain, shortness of breath  HPI: Patient is a 28 year old male with history of anxiety, tobacco use who presents to the emergency department with chest pain and shortness of breath for the past week.  States symptoms woke him up from sleep tonight.  Unable to describe the chest pain.  No radiation of pain.  No aggravating or alleviating factors.  Patient reports he has had similar symptoms previously.  No medications given prior to arrival.  No history of hypertension, diabetes, hyperlipidemia.  No history of PE, DVT, exogenous estrogen use, recent fractures, surgery, trauma, hospitalization or prolonged travel. No lower extremity swelling or pain. No calf tenderness.  Denies fevers or cough.  He does not have a primary care provider.  ROS: See HPI Constitutional: no fever  Eyes: no drainage  ENT: no runny nose   Cardiovascular:  chest pain  Resp: SOB  GI: no vomiting GU: no dysuria Integumentary: no rash  Allergy: no hives  Musculoskeletal: no leg swelling  Neurological: no slurred speech ROS otherwise negative  PAST MEDICAL HISTORY/PAST SURGICAL HISTORY:  Past Medical History:  Diagnosis Date  . Anxiety     MEDICATIONS:  Prior to Admission medications   Medication Sig Start Date End Date Taking? Authorizing Provider  albuterol (PROVENTIL HFA) 108 (90 Base) MCG/ACT inhaler Inhale 2 puffs into the lungs every 6 (six) hours as needed for wheezing or shortness of breath.    [provider]  benzonatate (TESSALON) 100 MG capsule Take 1 capsule (100 mg total) by mouth every 8 (eight) hours. 12/03/17   Mathews Robinsons B, PA-C  calcium carbonate (TUMS - DOSED IN MG ELEMENTAL CALCIUM) 500 MG chewable tablet Chew 1 tablet by mouth 3 (three) times daily as needed for indigestion or heartburn.    [provider]  Guaifenesin 1200 MG TB12 Take 1 tablet (1,200 mg total) 2 (two) times daily by mouth. Patient not taking: Reported on  10/05/2017 09/12/17   Charlestine Night, PA-C  ibuprofen (ADVIL,MOTRIN) 800 MG tablet Take 1 tablet (800 mg total) every 8 (eight) hours as needed by mouth. Patient not taking: Reported on 10/05/2017 09/12/17   Charlestine Night, PA-C  omeprazole (PRILOSEC) 20 MG capsule Take 1 capsule (20 mg total) by mouth daily. 10/06/17   Wallis Bamberg, PA-C  oseltamivir (TAMIFLU) 75 MG capsule Take 1 capsule (75 mg total) by mouth every 12 (twelve) hours. 01/05/18   Dione Booze, MD  promethazine-dextromethorphan (PROMETHAZINE-DM) 6.25-15 MG/5ML syrup Take 5 mLs 4 (four) times daily as needed by mouth for cough. Patient not taking: Reported on 10/05/2017 09/12/17   Charlestine Night, PA-C  ranitidine (ZANTAC) 150 MG tablet Take 1 tablet (150 mg total) by mouth 2 (two) times daily. 10/06/17   Wallis Bamberg, PA-C    ALLERGIES:  No Known Allergies  SOCIAL HISTORY:  Social History   Tobacco Use  . Smoking status: Former Smoker    Packs/day: 0.25    Types: Cigarettes  . Smokeless tobacco: Never Used  Substance Use Topics  . Alcohol use: No    Frequency: Never    FAMILY HISTORY: No family history on file.  EXAM: BP (!) 141/87 (BP Location: Right Arm)   Pulse 84   Temp 97.8 F (36.6 C) (Oral)   Resp 19   Ht 5\' 6"  (1.676 m)   Wt 82.6 kg   SpO2 100%   BMI 29.38 kg/m  CONSTITUTIONAL: Alert and oriented and responds appropriately to questions. Well-appearing; well-nourished HEAD: Normocephalic  EYES: Conjunctivae clear, pupils appear equal, EOMI ENT: normal nose; moist mucous membranes NECK: Supple, no meningismus, no nuchal rigidity, no LAD  CARD: RRR; S1 and S2 appreciated; no murmurs, no clicks, no rubs, no gallops CHEST:  Chest wall is tender to palpation over the anterior chest wall which reproduces his pain.  No crepitus, ecchymosis, erythema, warmth, rash or other lesions present.   RESP: Normal chest excursion without splinting or tachypnea; breath sounds clear and equal bilaterally;  no wheezes, no rhonchi, no rales, no hypoxia or respiratory distress, speaking full sentences ABD/GI: Normal bowel sounds; non-distended; soft, non-tender, no rebound, no guarding, no peritoneal signs, no hepatosplenomegaly BACK:  The back appears normal and is non-tender to palpation, there is no CVA tenderness EXT: Normal ROM in all joints; non-tender to palpation; no edema; normal capillary refill; no cyanosis, no calf tenderness or swelling    SKIN: Normal color for age and race; warm; no rash NEURO: Moves all extremities equally PSYCH: Appears anxious.  MEDICAL DECISION MAKING: Patient here with atypical chest pain.  Suspect chest wall pain.  Has been seen in the emergency department several times in the past for the same.  States he was seen in urgent care this week and was told that "everything was normal".  Patient appears very anxious today which I think is contributing to his chest pain.  He is PERC negative.  He has no risk factors other than tobacco use for ACS.  Have very low suspicion for ACS as this seems very atypical.  His EKG shows no ischemic changes, interval abnormalities or arrhythmia.  Will obtain chest x-ray for further evaluation.  Will give ibuprofen for pain.  Does not appear volume overloaded on exam.  Denies fevers or cough to suggest pneumonia.  ED PROGRESS: On reevaluation, patient reports he is feeling "okay".  He appears comfortable in the bed.  Seems less anxious than previously.  I suspect that anxiety is likely contributing to his symptoms today.  I suspect most of his pain is musculoskeletal in nature given reproducible with palpation.  Again doubt ACS, PE or dissection.  His chest x-ray today is clear without infiltrate, edema, pneumothorax.  Have attempted to reassure him that no life-threatening process presents today.  Recommended alternating Tylenol and Motrin for pain.  I feel he needs a primary care physician given ongoing symptoms and he agrees.  Given him a  list of primary care physicians in this area.  Discussed return precautions with patient and partner at bedside.  At this time, I do not feel there is any life-threatening condition present. I have reviewed and discussed all results (EKG, imaging, lab, urine as appropriate) and exam findings with patient/family. I have reviewed nursing notes and appropriate previous records.  I feel the patient is safe to be discharged home without further emergent workup and can continue workup as an outpatient as needed. Discussed usual and customary return precautions. Patient/family verbalize understanding and are comfortable with this plan.  Outpatient follow-up has been provided as needed. All questions have been answered.      EKG Interpretation  Date/Time:  Monday December 25 2018 02:13:06 EST Ventricular Rate:  72 PR Interval:    QRS Duration: 102 QT Interval:  370 QTC Calculation: 405 R Axis:   100 Text Interpretation:  Sinus rhythm Borderline right axis deviation ST elev, probable normal early repol pattern No significant change since last tracing Confirmed by Xian Apostol, Baxter HireKristen [phone] on 12/25/2018 2:15:52 AM  Caroline Longie, Layla Maw, DO 12/25/18 [phone]

## 2018-12-25 NOTE — ED Triage Notes (Signed)
Pt c/o 8/10 cp for about a week with SOB.

## 2018-12-25 NOTE — ED Notes (Signed)
Pt understood dc material. NAD noted. All questions answered to satisfaction. Pt and family escorted to check out counter 

## 2018-12-25 NOTE — Discharge Instructions (Signed)
You may alternate Tylenol 1000 mg every 6 hours as needed for pain and Ibuprofen 800 mg every 8 hours as needed for pain.  Please take Ibuprofen with food. ° °Steps to find a Primary Care Provider (PCP): ° °Call [phone] or [phone] to access "[facility] Find a Doctor Service." ° °2.  You may also go on the [facility] website at [email or web address] ° °3.  [facility] also frequently accepts new patients. ° °[facility]  °[address] °[place] [address] °[phone] ° °4.  There are also multiple [facility], [facility] and [facility] practices throughout the [place] that are frequently accepting new patients. You may find a clinic that is close to your home and contact them. ° °[facility] °[email or web address] °[phone] ° °[facility] °[email or web address] ° °[facility] °[email or web address] °[phone] ° °[facility] °[email or web address] °[phone] ° °5.  Local Health Departments also can provide primary care services. ° °[facility]  °[address] ° [place] [address] [phone] ° °[facility] °[address] °[place] [address] °[phone] ° °[facility] °[address]  °[place] [address] °[phone] ° ° °

## 2019-05-27 ENCOUNTER — Other Ambulatory Visit: Payer: Self-pay

## 2019-05-27 ENCOUNTER — Encounter (HOSPITAL_COMMUNITY): Payer: Self-pay | Admitting: Emergency Medicine

## 2019-05-27 ENCOUNTER — Ambulatory Visit (HOSPITAL_COMMUNITY)
Admission: EM | Admit: 2019-05-27 | Discharge: 2019-05-27 | Disposition: A | Discharge status: Home / Self Care | Payer: Self-pay | Attending: Family Medicine | Admitting: Family Medicine

## 2019-05-27 DIAGNOSIS — S39012A Strain of muscle, fascia and tendon of lower back, initial encounter: Secondary | ICD-10-CM

## 2019-05-27 DIAGNOSIS — M542 Cervicalgia: Secondary | ICD-10-CM

## 2019-05-27 MED ORDER — CYCLOBENZAPRINE HCL 5 MG PO TABS: 5.0000 mg | ORAL_TABLET | Freq: Two times a day (BID) | ORAL | 0 refills | Status: DC | PRN

## 2019-05-27 NOTE — Discharge Instructions (Signed)
Take muscle relaxer as needed for severe pain, spasm. °May ice, rest, elevate area is causing most pain.  Can also use hot compresses/warm wash rags to relieve muscle tightness. °May use OTC Tylenol, ibuprofen as needed for pain. °Return if you develop worsening pain, chest pain, difficulty breathing. °
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
## 2019-08-29 ENCOUNTER — Other Ambulatory Visit: Payer: Self-pay

## 2019-08-29 ENCOUNTER — Ambulatory Visit (INDEPENDENT_AMBULATORY_CARE_PROVIDER_SITE_OTHER): Payer: Self-pay | Admitting: Family Medicine

## 2019-08-29 ENCOUNTER — Encounter: Payer: Self-pay | Admitting: Family Medicine

## 2019-08-29 VITALS — BP 122/102 | HR 93 | Wt 210.8 lb

## 2019-08-29 DIAGNOSIS — R0683 Snoring: Secondary | ICD-10-CM

## 2019-08-29 DIAGNOSIS — F419 Anxiety disorder, unspecified: Secondary | ICD-10-CM

## 2019-08-29 DIAGNOSIS — F17200 Nicotine dependence, unspecified, uncomplicated: Secondary | ICD-10-CM

## 2019-08-29 DIAGNOSIS — F418 Other specified anxiety disorders: Secondary | ICD-10-CM | POA: Insufficient documentation

## 2019-08-29 DIAGNOSIS — K219 Gastro-esophageal reflux disease without esophagitis: Secondary | ICD-10-CM

## 2019-08-29 MED ORDER — PROPRANOLOL HCL 10 MG PO TABS: 10.0000 mg | ORAL_TABLET | Freq: Three times a day (TID) | ORAL | 2 refills | Status: DC | PRN

## 2019-08-29 MED ORDER — OMEPRAZOLE 20 MG PO CPDR: 20.0000 mg | DELAYED_RELEASE_CAPSULE | Freq: Every day | ORAL | 1 refills | Status: DC

## 2019-08-29 NOTE — Progress Notes (Signed)
    CHIEF COMPLAINT / HPI:  New patient Several trips to ED with chest pains. Says right now he is having similar 5/10 chest pain, sharp. Feels a little like reflux (has had thaht before) and a little like "stress". He reports long hx f anxiety as well. Chest discomfort occurs many days a week, sometimes more than once a day. Can last a few minutes or most of the day. Does seem to be correlated with stress as well as eating late at night (more frequent). Does not occur with exercise. Does occasionally occur at night and will awaken his\m but these are always associated with a severe panicky feeling and he usually has to "drink some water and get some air".  Has gained weight recently His famliy tells him he "stops breathing" at night and snores. Wonders if he has sleep apnea and would like to be tested for it   REVIEW OF SYSTEMS: See HPI  PERTINENT  PMH / PSH: I have reviewed the patient's medications, allergies, past medical and surgical history, smoking status and updated in the EMR as appropriate. Reviewed recent EKG which is negative for acute changes of ACS, negative for hypertrophy, negative for arrhythmia, negative for ST changes or TWI. No sign prior infarction  OBJECTIVE:  Vital signs reviewed GENERALl: Well developed, well nourished, in no acute distress. HEENT: PERRLA, EOMI, sclerae are nonicteric NECK: Supple, FROM, without lymphadenopathy.  THYROID: normal without nodularity CAROTID ARTERIES: without bruits LUNGS: clear to auscultation bilaterally. No wheezes or rales. Normal respiratory effort HEART: Regular rate and rhythm, no murmurs. Distal pulses are bilaterally symmetrical, 2+. ABDOMEN: soft with positive bowel sounds. No masses noted MSK: MOE x 4. Normal muscle strength, bulk and tone. SKIN no rash. Normal temperature. NEURO: no focal deficits. Normal gait. Normal balance. PSYCH: AxOx4. Good eye contact.. No psychomotor retardation or agitation. Appropriate speech  fluency and content. Asks and answers questions appropriately. Mood is congruent.  although he seems a bit anxious about his health.    ASSESSMENT / PLAN:   Anxiety Will start with some propranolol to see if it will help his symptoms of what sound like acute anxiety/panic attcacks where a major symptom is heart pounding and chest tightness. His BP can tolerate it. Will see him back in 2-3 weeks and re-assess--mayneed to consider SSRI as this sounds long standing.  Smoker 1/2 ppd He specifically asked if this ws "bad" for him and said he had promised to quit if a doctor ever told him it was. I rec D/C.  GERD (gastroesophageal reflux disease) I do belive he has reflux and this may be a trigger for his anxiety and chest pain. PPI and followup  Snores Sleep study ordered

## 2019-08-29 NOTE — Assessment & Plan Note (Signed)
Will start with some propranolol to see if it will help his symptoms of what sound like acute anxiety/panic attcacks where a major symptom is heart pounding and chest tightness. His BP can tolerate it. Will see him back in 2-3 weeks and re-assess--mayneed to consider SSRI as this sounds long standing.

## 2019-08-29 NOTE — Patient Instructions (Signed)
The single best thing you could do for your health is STOP SMOKING!  I am sending in 2 Rx: One is for acid reflux and I want you to take one pill every day (omperazole)  The other Rx is propranolol and take one by mouth if you start to get that heart racing, short of breath feeling or really anxious. You can take up to three a day if needed I will send in an order for a sleep study and they should call you directly. Let me know if they have not called you in 2 weeks  Let me see you in about a month  Great to meet you!

## 2019-08-29 NOTE — Assessment & Plan Note (Signed)
I do belive he has reflux and this may be a trigger for his anxiety and chest pain. PPI and followup

## 2019-08-29 NOTE — Assessment & Plan Note (Signed)
Sleep study ordered

## 2019-08-29 NOTE — Assessment & Plan Note (Signed)
1/2 ppd He specifically asked if this ws "bad" for him and said he had promised to quit if a doctor ever told him it was. I rec D/C.

## 2019-08-31 ENCOUNTER — Emergency Department (HOSPITAL_BASED_OUTPATIENT_CLINIC_OR_DEPARTMENT_OTHER): Payer: 59

## 2019-08-31 ENCOUNTER — Encounter (HOSPITAL_BASED_OUTPATIENT_CLINIC_OR_DEPARTMENT_OTHER): Payer: Self-pay

## 2019-08-31 ENCOUNTER — Other Ambulatory Visit: Payer: Self-pay

## 2019-08-31 ENCOUNTER — Emergency Department (HOSPITAL_BASED_OUTPATIENT_CLINIC_OR_DEPARTMENT_OTHER)
Admission: EM | Admit: 2019-08-31 | Discharge: 2019-08-31 | Disposition: A | Discharge status: Home / Self Care | Payer: 59 | Attending: Emergency Medicine | Admitting: Emergency Medicine

## 2019-08-31 DIAGNOSIS — R42 Dizziness and giddiness: Secondary | ICD-10-CM | POA: Diagnosis present

## 2019-08-31 DIAGNOSIS — F17210 Nicotine dependence, cigarettes, uncomplicated: Secondary | ICD-10-CM | POA: Insufficient documentation

## 2019-08-31 DIAGNOSIS — Z20828 Contact with and (suspected) exposure to other viral communicable diseases: Secondary | ICD-10-CM | POA: Diagnosis not present

## 2019-08-31 DIAGNOSIS — J45909 Unspecified asthma, uncomplicated: Secondary | ICD-10-CM | POA: Diagnosis not present

## 2019-08-31 DIAGNOSIS — Z79899 Other long term (current) drug therapy: Secondary | ICD-10-CM | POA: Diagnosis not present

## 2019-08-31 DIAGNOSIS — R072 Precordial pain: Secondary | ICD-10-CM | POA: Diagnosis not present

## 2019-08-31 DIAGNOSIS — Z20822 Contact with and (suspected) exposure to covid-19: Secondary | ICD-10-CM

## 2019-08-31 DIAGNOSIS — R55 Syncope and collapse: Secondary | ICD-10-CM | POA: Insufficient documentation

## 2019-08-31 HISTORY — DX: Unspecified asthma, uncomplicated: J45.909

## 2019-08-31 LAB — BASIC METABOLIC PANEL WITH GFR
Anion gap: 9 (ref 5–15)
BUN: 16 mg/dL (ref 6–20)
CO2: 23 mmol/L (ref 22–32)
Calcium: 8.9 mg/dL (ref 8.9–10.3)
Chloride: 106 mmol/L (ref 98–111)
Creatinine, Ser: 1.15 mg/dL (ref 0.61–1.24)
GFR calc Af Amer: >60 mL/min
GFR calc non Af Amer: >60 mL/min
Glucose, Bld: 99 mg/dL (ref 70–99)
Potassium: 3.8 mmol/L (ref 3.5–5.1)
Sodium: 138 mmol/L (ref 135–145)

## 2019-08-31 LAB — CBC
HCT: 45.9 % (ref 39.0–52.0)
Hemoglobin: 15 g/dL (ref 13.0–17.0)
MCH: 28.8 pg (ref 26.0–34.0)
MCHC: 32.7 g/dL (ref 30.0–36.0)
MCV: 88.3 fL (ref 80.0–100.0)
Platelets: 254 10*3/uL (ref 150–400)
RBC: 5.2 MIL/uL (ref 4.22–5.81)
RDW: 13.5 % (ref 11.5–15.5)
WBC: 8 10*3/uL (ref 4.0–10.5)
nRBC: 0 % (ref 0.0–0.2)

## 2019-08-31 LAB — TROPONIN I (HIGH SENSITIVITY)
Troponin I (High Sensitivity): 3 ng/L
Troponin I (High Sensitivity): 3 ng/L (ref <18)

## 2019-08-31 MED ORDER — SODIUM CHLORIDE 0.9 % IV BOLUS
1000.0000 mL | Freq: Once | INTRAVENOUS | Status: AC
  Administered 2019-08-31: 1000 mL via INTRAVENOUS

## 2019-08-31 MED ORDER — IOHEXOL 350 MG/ML SOLN
100.0000 mL | Freq: Once | INTRAVENOUS | Status: AC | PRN
  Administered 2019-08-31: 21:00:00 83 mL via INTRAVENOUS

## 2019-08-31 MED ORDER — NAPROXEN 500 MG PO TABS: 500.0000 mg | ORAL_TABLET | Freq: Two times a day (BID) | ORAL | 0 refills | Status: DC

## 2019-08-31 MED ORDER — SODIUM CHLORIDE 0.9 % IV SOLN: INTRAVENOUS | Status: DC

## 2019-08-31 NOTE — Discharge Instructions (Addendum)
Work-up for the chest pain without any evidence of acute heart problem.  Also no evidence of any pneumonia in the lungs or any blood clots in the lungs.  CT head without any acute findings.  Symptoms could be consistent with COVID-19 infection.  Testing done.  Relation until results are back which should be in about 24 hours.  Return for any new or worse symptoms.  Make an appointment to follow-up with your doctor.  Take the Naprosyn as directed.

## 2019-08-31 NOTE — ED Triage Notes (Signed)
Pt presents with chest pain- SOB- dizziness that began last night but became worse tonight. Pt states "he thought he was going to pass out". Also c/o HA.

## 2019-08-31 NOTE — ED Provider Notes (Signed)
[facility] EMERGENCY DEPARTMENT Provider Note   CSN: [id number] Arrival date & time: 08/31/19  1831     History   Chief Complaint Chief Complaint  Patient presents with  . Dizziness    HPI Peter Key is a 28 y.o. male.     patient presents with complaint of chest pain substernal intermittent but will be present for 2 hours at a time the may be go away for an hour and then come back.  Has been ongoing for 2 days.  Associated with some shortness of breath worse with taking deep breaths.  Associated with some nausea and some vomiting also there is a cough.  No fever.  Does radiate to both arms.  Yesterday patient started with dizziness and then today had dizziness and felt as if he was going to pass out.  Is been a little bit of room spinning associated with it there is been a mild headache.  Patient did not have a syncopal episode.  Patient's cardiac risk factors no family history of premature coronary artery disease.  No hypertension no diabetes.  Patient is a smoker.  Patient denies any body aches.     Past Medical History:  Diagnosis Date  . Anxiety   . Asthma     Patient Active Problem List   Diagnosis Date Noted  . Anxiety 08/29/2019  . Smoker 08/29/2019  . GERD (gastroesophageal reflux disease) 08/29/2019  . Snores 08/29/2019    Past Surgical History:  Procedure Laterality Date  . TONSILLECTOMY          Home Medications    Prior to Admission medications   Medication Sig Start Date End Date Taking? Authorizing Provider  naproxen (NAPROSYN) 500 MG tablet Take 1 tablet (500 mg total) by mouth 2 (two) times daily. 08/31/19   Vanetta MuldersZackowski, Aleja Yearwood, MD  omeprazole (PRILOSEC) 20 MG capsule Take 1 capsule (20 mg total) by mouth daily. 08/29/19   Nestor RampNeal, Sara L, MD  propranolol (INDERAL) 10 MG tablet Take 1 tablet (10 mg total) by mouth 3 (three) times daily as needed. 08/29/19   Nestor RampNeal, Sara L, MD    Family History No family history on file.  Social  History Social History   Tobacco Use  . Smoking status: Current Every Day Smoker    Packs/day: 0.25    Types: Cigarettes  . Smokeless tobacco: Never Used  Substance Use Topics  . Alcohol use: No    Frequency: Never  . Drug use: No     Allergies   Patient has no known allergies.   Review of Systems Review of Systems  Constitutional: Negative for chills and fever.  HENT: Negative for congestion, rhinorrhea and sore throat.   Eyes: Negative for photophobia, pain and visual disturbance.  Respiratory: Positive for cough and shortness of breath.   Cardiovascular: Positive for chest pain. Negative for leg swelling.  Gastrointestinal: Negative for abdominal pain, diarrhea, nausea and vomiting.  Genitourinary: Negative for dysuria.  Musculoskeletal: Negative for back pain and neck pain.  Skin: Negative for rash.  Neurological: Positive for dizziness and headaches. Negative for light-headedness.  Hematological: Does not bruise/bleed easily.  Psychiatric/Behavioral: Negative for confusion.     Physical Exam Updated Vital Signs BP (!) 128/99   Pulse 84   Temp 98.8 F (37.1 C)   Resp 17   Ht 1.676 m (5\' 6" )   Wt 95.3 kg   SpO2 97%   BMI 33.89 kg/m   Physical Exam Vitals signs and nursing note  reviewed.  Constitutional:      General: He is not in acute distress.    Appearance: Normal appearance. He is well-developed.  HENT:     Head: Normocephalic and atraumatic.  Eyes:     Extraocular Movements: Extraocular movements intact.     Conjunctiva/sclera: Conjunctivae normal.     Pupils: Pupils are equal, round, and reactive to light.  Neck:     Musculoskeletal: Normal range of motion and neck supple.  Cardiovascular:     Rate and Rhythm: Normal rate and regular rhythm.     Heart sounds: No murmur.  Pulmonary:     Effort: Pulmonary effort is normal. No respiratory distress.     Breath sounds: Normal breath sounds. No wheezing or rales.  Abdominal:     Palpations:  Abdomen is soft.     Tenderness: There is no abdominal tenderness.  Musculoskeletal: Normal range of motion.        General: No swelling.  Skin:    General: Skin is warm and dry.     Capillary Refill: Capillary refill takes less than 2 seconds.  Neurological:     General: No focal deficit present.     Mental Status: He is alert and oriented to person, place, and time.     Cranial Nerves: No cranial nerve deficit.     Sensory: No sensory deficit.      ED Treatments / Results  Labs (all labs ordered are listed, but only abnormal results are displayed) Labs Reviewed  SARS CORONAVIRUS 2 (TAT 6-24 HRS)  CBC  BASIC METABOLIC PANEL  TROPONIN I (HIGH SENSITIVITY)  TROPONIN I (HIGH SENSITIVITY)    EKG EKG Interpretation  Date/Time:  Friday August 31 2019 18:44:29 EST Ventricular Rate:  83 PR Interval:    QRS Duration: 103 QT Interval:  350 QTC Calculation: 412 R Axis:   89 Text Interpretation: Sinus rhythm ST elev, probable normal early repol pattern No significant change since last tracing Confirmed by Vanetta Mulders [phone]) on 08/31/2019 7:08:29 PM   Radiology Dg Chest 2 View  Result Date: 08/31/2019 CLINICAL DATA:  Headache, dizziness for 2 days EXAM: CHEST - 2 VIEW COMPARISON:  12/25/2018 FINDINGS: The heart size and mediastinal contours are within normal limits. Both lungs are clear. The visualized skeletal structures are unremarkable. IMPRESSION: No acute abnormality of the lungs. Electronically Signed   By: Lauralyn Primes M.D.   On: 08/31/2019 19:45   Ct Head Wo Contrast  Result Date: 08/31/2019 CLINICAL DATA:  Ataxia dizzy EXAM: CT HEAD WITHOUT CONTRAST TECHNIQUE: Contiguous axial images were obtained from the base of the skull through the vertex without intravenous contrast. COMPARISON:  None. FINDINGS: Brain: No evidence of acute infarction, hemorrhage, hydrocephalus, extra-axial collection or mass lesion/mass effect. Vascular: No hyperdense vessel or unexpected  calcification. Skull: Normal. Negative for fracture or focal lesion. Sinuses/Orbits: No acute finding. Other: None IMPRESSION: Negative non contrasted CT appearance of the brain Electronically Signed   By: Jasmine Pang M.D.   On: 08/31/2019 21:22   Ct Angio Chest Pe W/cm &/or Wo Cm  Result Date: 08/31/2019 CLINICAL DATA:  Dyspnea dizziness EXAM: CT ANGIOGRAPHY CHEST WITH CONTRAST TECHNIQUE: Multidetector CT imaging of the chest was performed using the standard protocol during bolus administration of intravenous contrast. Multiplanar CT image reconstructions and MIPs were obtained to evaluate the vascular anatomy. CONTRAST:  57mL OMNIPAQUE IOHEXOL 350 MG/ML SOLN COMPARISON:  Chest x-ray 08/31/2019 FINDINGS: Cardiovascular: Satisfactory opacification of the pulmonary arteries to the segmental level. No evidence  of pulmonary embolism. Normal heart size. No pericardial effusion. Nonaneurysmal aorta. No dissection is seen Mediastinum/Nodes: No enlarged mediastinal, hilar, or axillary lymph nodes. Thyroid gland, trachea, and esophagus demonstrate no significant findings. Lungs/Pleura: Lungs are clear. No pleural effusion or pneumothorax. Upper Abdomen: No acute abnormality. Musculoskeletal: No chest wall abnormality. No acute or significant osseous findings. Review of the MIP images confirms the above findings. IMPRESSION: Negative. No CT evidence for acute pulmonary embolus. Clear lung fields Electronically Signed   By: Donavan Foil M.D.   On: 08/31/2019 21:26    Procedures Procedures (including critical care time)  Medications Ordered in ED Medications  0.9 %  sodium chloride infusion (has no administration in time range)  sodium chloride 0.9 % bolus 1,000 mL (0 mLs Intravenous Stopped 08/31/19 2248)  iohexol (OMNIPAQUE) 350 MG/ML injection 100 mL (83 mLs Intravenous Contrast Given 08/31/19 2105)     Initial Impression / Assessment and Plan / ED Course  I have reviewed the triage vital signs and the  nursing notes.  Pertinent labs & imaging results that were available during my care of the patient were reviewed by me and considered in my medical decision making (see chart for details).        Patient work-up for the chest pain troponin high-sensitivity 3x2.  EKG without any acute findings.  Due to the shortness of breath and the chest pain and pleuritic kind of chest pain although not reproducible patient had CT angio chest which was negative for any evidence of pneumonia or any evidence of pulmonary embolus.  Patient's head CT without any acute findings.  Patient without really true vertigo but has had the headache.  Constellation of symptoms we have ruled out pulmonary embolus pneumonia pneumothorax acute cardiac event but the constellation of the symptoms very well could be viral in nature.  Little suspicious that it may be an atypical presentation of COVID-19 infection.  Outpatient Covid testing done.  Patient will isolate.  We will treat him with Naprosyn for the chest pain.  And patient will return for any new or worse symptoms.   Final Clinical Impressions(s) / ED Diagnoses   Final diagnoses:  Precordial pain  Dizziness  Near syncope  Suspected COVID-19 virus infection    ED Discharge Orders         Ordered    naproxen (NAPROSYN) 500 MG tablet  2 times daily     08/31/19 2249           Fredia Sorrow, MD 08/31/19 2320

## 2019-08-31 NOTE — ED Notes (Signed)
Patient transported to X-ray 

## 2019-08-31 NOTE — ED Notes (Signed)
Pt on monitor 

## 2019-08-31 NOTE — ED Notes (Signed)
Patient transported to CT 

## 2019-09-02 LAB — SARS CORONAVIRUS 2 (TAT 6-24 HRS): SARS Coronavirus 2: NEGATIVE

## 2019-09-26 ENCOUNTER — Other Ambulatory Visit (HOSPITAL_COMMUNITY)
Admission: RE | Admit: 2019-09-26 | Discharge: 2019-09-26 | Disposition: A | Discharge status: Home / Self Care | Payer: 59 | Source: Ambulatory Visit | Attending: Internal Medicine | Admitting: Internal Medicine

## 2019-09-26 DIAGNOSIS — Z01812 Encounter for preprocedural laboratory examination: Secondary | ICD-10-CM | POA: Diagnosis present

## 2019-09-26 DIAGNOSIS — Z20828 Contact with and (suspected) exposure to other viral communicable diseases: Secondary | ICD-10-CM | POA: Diagnosis not present

## 2019-09-27 LAB — NOVEL CORONAVIRUS, NAA (HOSP ORDER, SEND-OUT TO REF LAB; TAT 18-24 HRS): SARS-CoV-2, NAA: NOT DETECTED

## 2019-09-29 ENCOUNTER — Ambulatory Visit (HOSPITAL_BASED_OUTPATIENT_CLINIC_OR_DEPARTMENT_OTHER): Payer: 59 | Admitting: Internal Medicine

## 2019-09-30 ENCOUNTER — Other Ambulatory Visit: Payer: Self-pay

## 2019-09-30 ENCOUNTER — Ambulatory Visit (HOSPITAL_BASED_OUTPATIENT_CLINIC_OR_DEPARTMENT_OTHER): Payer: 59 | Attending: Family Medicine | Admitting: Internal Medicine

## 2019-09-30 DIAGNOSIS — R0683 Snoring: Secondary | ICD-10-CM | POA: Diagnosis present

## 2019-09-30 DIAGNOSIS — R0902 Hypoxemia: Secondary | ICD-10-CM | POA: Insufficient documentation

## 2019-09-30 DIAGNOSIS — G4733 Obstructive sleep apnea (adult) (pediatric): Secondary | ICD-10-CM | POA: Diagnosis not present

## 2019-10-01 ENCOUNTER — Other Ambulatory Visit: Payer: Self-pay

## 2019-10-06 DIAGNOSIS — R0683 Snoring: Secondary | ICD-10-CM | POA: Diagnosis not present

## 2019-10-06 NOTE — Procedures (Signed)
    Patient Name: Peter Key, Peter Key Date: 09/30/2019 Gender: Male D.O.B: Jan 16, 1991 Age (years): 28 Referring Provider: Dorcas Mcmurray Height (inches): 41 Interpreting Physician: Baird Lyons MD, ABSM Weight (lbs): 210 RPSGT: Lanae Boast BMI: 34 MRN: [id number] Neck Size: 15.50  CLINICAL INFORMATION Sleep Study Type: NPSG Indication for sleep study: Snoring Epworth Sleepiness Score: 8  SLEEP STUDY TECHNIQUE As per the AASM Manual for the Scoring of Sleep and Associated Events v2.3 (April 2016) with a hypopnea requiring 4% desaturations.  The channels recorded and monitored were frontal, central and occipital EEG, electrooculogram (EOG), submentalis EMG (chin), nasal and oral airflow, thoracic and abdominal wall motion, anterior tibialis EMG, snore microphone, electrocardiogram, and pulse oximetry.  MEDICATIONS Medications self-administered by patient taken the night of the study : none reported  SLEEP ARCHITECTURE The study was initiated at 10:07:39 PM and ended at 5:05:54 AM.  Sleep onset time was 52.9 minutes and the sleep efficiency was 83.7%%. The total sleep time was 349.9 minutes.  Stage REM latency was 84.0 minutes.  The patient spent 16.3%% of the night in stage N1 sleep, 52.4%% in stage N2 sleep, 5.7%% in stage N3 and 25.6% in REM.  Alpha intrusion was absent.  Supine sleep was 0.00%.  RESPIRATORY PARAMETERS The overall apnea/hypopnea index (AHI) was 44.6 per hour. There were 2 total apneas, including 2 obstructive, 0 central and 0 mixed apneas. There were 258 hypopneas and 18 RERAs.  The AHI during Stage REM sleep was 28.2 per hour.  AHI while supine was N/A per hour.  The mean oxygen saturation was 92.7%. The minimum SpO2 during sleep was 74.0%.  loud snoring was noted during this study.  CARDIAC DATA The 2 lead EKG demonstrated sinus rhythm. The mean heart rate was 77.0 beats per minute. Other EKG findings include: None.  LEG MOVEMENT  DATA The total PLMS were 0 with a resulting PLMS index of 0.0. Associated arousal with leg movement index was 0.0 .  IMPRESSIONS - Severe obstructive sleep apnea occurred during this study (AHI = 44.6/h). - No significant central sleep apnea occurred during this study (CAI = 0.0/h). - Moderate oxygen desaturation was noted during this study (Min O2 = 74.0%). Mean sat 92.7%. - Time with oxygen saturation 88% or less was 24.7 minutes. - The patient snored with loud snoring volume. - No cardiac abnormalities were noted during this study. - Clinically significant periodic limb movements did not occur during sleep. No significant associated arousals.  DIAGNOSIS - Obstructive Sleep Apnea (327.23 [G47.33 ICD-10]) - Nocturnal Hypoxemia (327.26 [G47.36 ICD-10])  RECOMMENDATIONS - Therapeutic CPAP titration sleep study or autopap. Other options would be based on clinical judgment. - Be careful with alcohol, sedatives and other CNS depressants that may worsen sleep apnea and disrupt normal sleep architecture. - Sleep hygiene should be reviewed to assess factors that may improve sleep quality. - Weight management and regular exercise should be initiated or continued if appropriate.  [Electronically signed] 10/06/2019 12:06 PM  Baird Lyons MD, [place], American Board of Sleep Medicine   NPI: [id number]                         [place], [place] of Sleep Medicine  ELECTRONICALLY SIGNED ON:  10/06/2019, 12:03 PM [place] PH: [phone]   FX: [phone] [place]

## 2019-10-10 ENCOUNTER — Other Ambulatory Visit: Payer: Self-pay | Admitting: Family Medicine

## 2019-10-10 DIAGNOSIS — G4733 Obstructive sleep apnea (adult) (pediatric): Secondary | ICD-10-CM

## 2019-10-10 NOTE — Progress Notes (Signed)
  Notified pt of SEVERE s;eep apnea and need for titration. Order placed. He is instructed to follow up w me in 4-6 w.

## 2019-11-07 ENCOUNTER — Other Ambulatory Visit: Payer: Self-pay | Admitting: Family Medicine

## 2019-12-19 ENCOUNTER — Other Ambulatory Visit (HOSPITAL_COMMUNITY)
Admission: RE | Admit: 2019-12-19 | Discharge: 2019-12-19 | Disposition: A | Discharge status: Home / Self Care | Payer: 59 | Source: Ambulatory Visit | Attending: Internal Medicine | Admitting: Internal Medicine

## 2019-12-19 DIAGNOSIS — Z20822 Contact with and (suspected) exposure to covid-19: Secondary | ICD-10-CM | POA: Insufficient documentation

## 2019-12-19 DIAGNOSIS — Z01812 Encounter for preprocedural laboratory examination: Secondary | ICD-10-CM | POA: Diagnosis present

## 2019-12-19 LAB — SARS CORONAVIRUS 2 (TAT 6-24 HRS): SARS Coronavirus 2: NEGATIVE

## 2019-12-21 ENCOUNTER — Ambulatory Visit (HOSPITAL_BASED_OUTPATIENT_CLINIC_OR_DEPARTMENT_OTHER): Payer: 59 | Attending: Family Medicine | Admitting: Internal Medicine

## 2019-12-21 ENCOUNTER — Other Ambulatory Visit: Payer: Self-pay

## 2019-12-21 ENCOUNTER — Telehealth: Payer: Self-pay | Admitting: *Deleted

## 2019-12-21 DIAGNOSIS — G4733 Obstructive sleep apnea (adult) (pediatric): Secondary | ICD-10-CM | POA: Insufficient documentation

## 2019-12-21 NOTE — Telephone Encounter (Signed)
Pt called to get test results for covid test taken prior to sleep study, informed pt that it was neg. Riven Mabile Zimmerman Rumple, CMA

## 2019-12-30 DIAGNOSIS — G4733 Obstructive sleep apnea (adult) (pediatric): Secondary | ICD-10-CM

## 2019-12-30 NOTE — Procedures (Signed)
Patient Name: Peter Key, Peter Key Date: 12/21/2019 Gender: Male D.O.B: 12/04/1990 Age (years): 28 Referring Provider: Claiborne Rigg NP Height (inches): 66 Interpreting Physician: Jetty Duhamel MD, ABSM Weight (lbs): 210 RPSGT: Cherylann Parr BMI: 34 MRN: [id number] Neck Size: 15.50  CLINICAL INFORMATION The patient is referred for a CPAP titration to treat sleep apnea.  Date of NPSG, Split Night or HST: NPSG 09/30/2019  AHI 44.6/ hr, desaturation to 74%,  body weight 210 lbs  SLEEP STUDY TECHNIQUE As per the AASM Manual for the Scoring of Sleep and Associated Events v2.3 (April 2016) with a hypopnea requiring 4% desaturations.  The channels recorded and monitored were frontal, central and occipital EEG, electrooculogram (EOG), submentalis EMG (chin), nasal and oral airflow, thoracic and abdominal wall motion, anterior tibialis EMG, snore microphone, electrocardiogram, and pulse oximetry. Continuous positive airway pressure (CPAP) was initiated at the beginning of the study and titrated to treat sleep-disordered breathing.  MEDICATIONS Medications self-administered by patient taken the night of the study : none reported  TECHNICIAN COMMENTS Comments added by technician: Patient had difficulty initiating sleep. AIR-FIT F10 MEDIUM MASK WAS USED FOR THIS STUDY Comments added by scorer: N/A RESPIRATORY PARAMETERS Optimal PAP Pressure (cm): 14 AHI at Optimal Pressure (/hr): 1.5 Overall Minimal O2 (%): 83.0 Supine % at Optimal Pressure (%): 0 Minimal O2 at Optimal Pressure (%): 93.0   SLEEP ARCHITECTURE The study was initiated at 9:54:50 PM and ended at 3:54:56 AM.  Sleep onset time was 7.5 minutes and the sleep efficiency was 73.7%%. The total sleep time was 265.5 minutes.  The patient spent 4.7%% of the night in stage N1 sleep, 76.1%% in stage N2 sleep, 0.0%% in stage N3 and 19.2% in REM.Stage REM latency was 186.5 minutes  Wake after sleep onset was 87.1. Alpha intrusion  was absent. Supine sleep was 0.00%.  CARDIAC DATA The 2 lead EKG demonstrated sinus rhythm. The mean heart rate was 71.7 beats per minute. Other EKG findings include: None.  LEG MOVEMENT DATA The total Periodic Limb Movements of Sleep (PLMS) were 0. The PLMS index was 0.0. A PLMS index of <15 is considered normal in adults.  IMPRESSIONS - The optimal PAP pressure was 14 cm of water. - Central sleep apnea was not noted during this titration (CAI = 2.0/h). - Moderate oxygen desaturations were observed during this titration (min O2 = 83.0%). Minimum saturation on CPAP 14 was 93%. - No snoring was audible during this study. - No cardiac abnormalities were observed during this study. - Clinically significant periodic limb movements were not noted during this study.  DIAGNOSIS - Obstructive Sleep Apnea (327.23 [G47.33 ICD-10])  RECOMMENDATIONS - Trial of CPAP therapy on 14 cm H2O or autopap 10-18. - Patient used a Medium size Resmed Full Face Mask AirFit F10 mask and heated humidification. - Be careful with alcohol, sedatives and other CNS depressants that may worsen sleep apnea and disrupt normal sleep architecture. - Sleep hygiene should be reviewed to assess factors that may improve sleep quality. - Weight management and regular exercise should be initiated or continued.  [Electronically signed] 12/30/2019 10:02 AM  Jetty Duhamel MD, ABSM Diplomate, American Board of Sleep Medicine   NPI: [id number]                         Jetty Duhamel Diplomate, American Board of Sleep Medicine  ELECTRONICALLY SIGNED ON:  12/30/2019, 9:59 AM [facility] PH: [phone]   FX: [phone]  [place] OF [facility]

## 2020-01-15 ENCOUNTER — Encounter: Payer: Self-pay | Admitting: Family Medicine

## 2020-01-15 DIAGNOSIS — G4733 Obstructive sleep apnea (adult) (pediatric): Secondary | ICD-10-CM | POA: Insufficient documentation

## 2020-01-23 ENCOUNTER — Ambulatory Visit: Payer: 59 | Admitting: Family Medicine

## 2020-01-23 ENCOUNTER — Encounter: Payer: Self-pay | Admitting: Family Medicine

## 2020-01-23 ENCOUNTER — Other Ambulatory Visit: Payer: Self-pay

## 2020-01-23 DIAGNOSIS — K219 Gastro-esophageal reflux disease without esophagitis: Secondary | ICD-10-CM | POA: Diagnosis not present

## 2020-01-23 DIAGNOSIS — F418 Other specified anxiety disorders: Secondary | ICD-10-CM | POA: Diagnosis not present

## 2020-01-23 DIAGNOSIS — G4733 Obstructive sleep apnea (adult) (pediatric): Secondary | ICD-10-CM

## 2020-01-23 MED ORDER — PANTOPRAZOLE SODIUM 40 MG PO TBEC: 40.0000 mg | DELAYED_RELEASE_TABLET | Freq: Every day | ORAL | 3 refills | Status: DC

## 2020-01-23 MED ORDER — CITALOPRAM HYDROBROMIDE 20 MG PO TABS: 20.0000 mg | ORAL_TABLET | Freq: Every day | ORAL | 1 refills | Status: DC

## 2020-01-23 NOTE — Assessment & Plan Note (Signed)
Discontinue omeprazole and switch to pantoprazole.  Follow-up the next 4 weeks.

## 2020-01-23 NOTE — Patient Instructions (Signed)
I will send you a message in My Chart about some therapy resources I have stopped your omeprazole and will have you start pantoprazole in its place ofr heart burn. I am also staring you on citalopram for anxiety and depression issues.  I will have Home Health call you about your CPAP machine. Please see me back in 3-4 weeks.

## 2020-01-23 NOTE — Progress Notes (Signed)
    CHIEF COMPLAINT / HPI: #1.  Follow-up of sleep study: He did not get the letter I sent him.  He has not had his CPAP machine set up.  He would like to do that. 2.  I had started him on some propranolol for anxiety that was mostly situational.  He said it made him feel funny so he has not used it.  His anxiety and depression have actually worsened since I saw him last.  He is having difficulty focusing at work.  Having mood lability.  Had one episode couple of weeks ago where things seem to really get worse for him and he had some thoughts of "ending his life".  He called his grandmother who is here with him today and they talked it through.  He was thinking of ending his life by driving his car into something.  Since then he is not had any thoughts of suicide.  He is having difficulty at work and is currently under some type of human resources investigation.  He is fearful he may lose his job this Friday.  Feels like he just needs some time off.  Asks about FMLA. No illicit substance use. 3. GERD symptoms improved but did not resolve on the omeprazole.  He still having breakthrough heartburn symptoms.  No vomiting, no nausea.  Bowel movements are normal.  No weight loss.   PERTINENT  PMH / PSH: I have reviewed the patient's medications, allergies, past medical and surgical history, smoking status and updated in the EMR as appropriate.   OBJECTIVE:  BP (!) 124/92   Pulse 98   Ht 5\' 6"  (1.676 m)   Wt 222 lb (100.7 kg)   SpO2 96%   BMI 35.83 kg/m  Vital signs reviewed. GENERAL: Well-developed, well-nourished, no acute distress. CV RRR RESP: no unusual work of nreathing PSYCH: AxOx4. Good eye contact.. No psychomotor retardation or agitation. Appropriate speech fluency and content. Asks and answers questions appropriately. Mood is congruent.    ASSESSMENT / PLAN:   No problem-specific Assessment & Plan notes found for this encounter.   MD

## 2020-01-23 NOTE — Assessment & Plan Note (Addendum)
Anxiety with some depressive symptoms.  We will start citalopram.  Follow-up 2 to 3 weeks.  I will find him some resources for therapist. Should he desire FMLA, he will drop the forms off and I will be happy to fill them out.

## 2020-01-23 NOTE — Assessment & Plan Note (Signed)
Home health to set up CPAP

## 2020-01-24 ENCOUNTER — Telehealth: Payer: Self-pay | Admitting: Psychology

## 2020-01-24 ENCOUNTER — Telehealth: Payer: Self-pay

## 2020-01-24 ENCOUNTER — Other Ambulatory Visit: Payer: Self-pay | Admitting: Family Medicine

## 2020-01-24 DIAGNOSIS — G4733 Obstructive sleep apnea (adult) (pediatric): Secondary | ICD-10-CM

## 2020-01-24 NOTE — Telephone Encounter (Signed)
Ott, Jennifer L  Thamar Holik P, CMA; Ott, Jennifer L  Got it.   

## 2020-01-24 NOTE — Telephone Encounter (Signed)
Schedule BH appt for 4/13 at 4pm.  Pt was given suicide hotline number and instructed to go to [facility] at [place] cone of depression worsens.

## 2020-01-24 NOTE — Telephone Encounter (Signed)
Community message has been sent to [facility] at [employer].

## 2020-01-24 NOTE — Telephone Encounter (Signed)
-----   Message from Nestor Ramp, MD sent at 01/24/2020  3:23 PM EDT ----- I placed an order for his CPAP machine THANKS! Peter Key

## 2020-02-05 ENCOUNTER — Other Ambulatory Visit: Payer: Self-pay

## 2020-02-05 ENCOUNTER — Ambulatory Visit (INDEPENDENT_AMBULATORY_CARE_PROVIDER_SITE_OTHER): Payer: 59 | Admitting: Psychology

## 2020-02-05 DIAGNOSIS — F418 Other specified anxiety disorders: Secondary | ICD-10-CM

## 2020-02-05 NOTE — BH Specialist Note (Signed)
Integrated Behavioral Health Visit   02/05/2020 Peter Key [id number]   Session Start time: 4  Session End time: 445 Total time: 45   Referring Provider: Dr. Jennette Kettle   PRESENTING CONCERNS: Patient and/or family reports the following symptoms/concerns: Pt reported feeling depressed with anger outbursts.  Pt denied HI.  Pt reported he has a history of trauma in his childhood and strained relationship with parents.  Pt reported a few weeks ago he had thought of suicide of possibly. driving his car off the road or taking pills. Pt reported he called his grandmother for support.  Pt denied current SI and reported he feels comfortable calling suicide hotline or family if reoccurs.   Pt and Dr. Shawnee Knapp engaged in conversation about how is depression is related to his anger.  Pt and Dr. Shawnee Knapp reviewed "anger iceberg" to better understand emotions and cognitions.   Duration of problem: few years; Severity of problem: moderate  STRENGTHS (Protective Factors/Coping Skills): Family support   GOALS ADDRESSED: Patient will: 1.  Reduce symptoms of: depression  2.  Increase knowledge and/or ability of: coping skills : recognizing emotions and be able to challenge anger  3.  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   Patient may benefit from CBT therapy to label emotions and challenge cognitions.   PLAN: 1. Follow up with behavioral health clinician on : paying attention to when angry 2. Behavioral recommendations: f/u therapy  3. Referral(s): [facility] Clinic) Royetta Asal, PhD., LMFT-A

## 2020-02-07 ENCOUNTER — Other Ambulatory Visit: Payer: Self-pay

## 2020-02-07 ENCOUNTER — Emergency Department (HOSPITAL_COMMUNITY)
Admission: EM | Admit: 2020-02-07 | Discharge: 2020-02-07 | Disposition: A | Discharge status: Home / Self Care | Payer: 59 | Attending: Emergency Medicine | Admitting: Emergency Medicine

## 2020-02-07 ENCOUNTER — Emergency Department (HOSPITAL_COMMUNITY): Payer: 59

## 2020-02-07 ENCOUNTER — Encounter (HOSPITAL_COMMUNITY): Payer: Self-pay

## 2020-02-07 DIAGNOSIS — F17210 Nicotine dependence, cigarettes, uncomplicated: Secondary | ICD-10-CM | POA: Diagnosis not present

## 2020-02-07 DIAGNOSIS — Z20822 Contact with and (suspected) exposure to covid-19: Secondary | ICD-10-CM | POA: Diagnosis not present

## 2020-02-07 DIAGNOSIS — R197 Diarrhea, unspecified: Secondary | ICD-10-CM | POA: Insufficient documentation

## 2020-02-07 DIAGNOSIS — R0602 Shortness of breath: Secondary | ICD-10-CM

## 2020-02-07 DIAGNOSIS — R112 Nausea with vomiting, unspecified: Secondary | ICD-10-CM | POA: Diagnosis not present

## 2020-02-07 DIAGNOSIS — R079 Chest pain, unspecified: Secondary | ICD-10-CM

## 2020-02-07 DIAGNOSIS — R05 Cough: Secondary | ICD-10-CM | POA: Diagnosis not present

## 2020-02-07 DIAGNOSIS — B349 Viral infection, unspecified: Secondary | ICD-10-CM | POA: Diagnosis not present

## 2020-02-07 LAB — CBC
HCT: 50 % (ref 39.0–52.0)
Hemoglobin: 16.5 g/dL (ref 13.0–17.0)
MCH: 28.7 pg (ref 26.0–34.0)
MCHC: 33 g/dL (ref 30.0–36.0)
MCV: 87 fL (ref 80.0–100.0)
Platelets: 291 10*3/uL (ref 150–400)
RBC: 5.75 MIL/uL (ref 4.22–5.81)
RDW: 13.8 % (ref 11.5–15.5)
WBC: 6.2 10*3/uL (ref 4.0–10.5)
nRBC: 0 % (ref 0.0–0.2)

## 2020-02-07 LAB — BASIC METABOLIC PANEL
Anion gap: 12 (ref 5–15)
BUN: 11 mg/dL (ref 6–20)
CO2: 27 mmol/L (ref 22–32)
Calcium: 9.5 mg/dL (ref 8.9–10.3)
Chloride: 102 mmol/L (ref 98–111)
Creatinine, Ser: 1.28 mg/dL — ABNORMAL HIGH (ref 0.61–1.24)
GFR calc Af Amer: >60 mL/min (ref >60)
GFR calc non Af Amer: >60 mL/min (ref >60)
Glucose, Bld: 100 mg/dL — ABNORMAL HIGH (ref 70–99)
Potassium: 3.8 mmol/L (ref 3.5–5.1)
Sodium: 141 mmol/L (ref 135–145)

## 2020-02-07 LAB — TROPONIN I (HIGH SENSITIVITY)
Troponin I (High Sensitivity): 4 ng/L (ref <18)
Troponin I (High Sensitivity): 4 ng/L (ref <18)

## 2020-02-07 LAB — SARS CORONAVIRUS 2 (TAT 6-24 HRS): SARS Coronavirus 2: NEGATIVE

## 2020-02-07 LAB — POC SARS CORONAVIRUS 2 AG -  ED: SARS Coronavirus 2 Ag: NEGATIVE

## 2020-02-07 NOTE — ED Provider Notes (Signed)
[facility] EMERGENCY DEPARTMENT Provider Note   CSN: [id number] Arrival date & time: 02/07/20  8466     History Chief Complaint  Patient presents with  . Shortness of Breath  . Emesis  . Diarrhea    Peter Key is a 29 y.o. male.  HPI He presents for evaluation of a complex of symptoms including shortness of breath, vomiting and diarrhea.  He has a history of obstructive sleep apnea, GERD, and tobacco abuse.  He describes an illness for 24 hours, with fever, chills, cough, nausea, vomiting and diarrhea.  He works in an Sales promotion account executive, as a Pensions consultant.  Last test for Covid was about 2 weeks ago.  He has not previously been diagnosed with COVID-19.  He denies ongoing chest discomfort, or significant shortness of breath.  There are no other known modifying factors.    Past Medical History:  Diagnosis Date  . Anxiety   . Asthma     Patient Active Problem List   Diagnosis Date Noted  . OSA (obstructive sleep apnea) 01/15/2020  . 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 Smoker 08/29/2019  . GERD (gastroesophageal reflux disease) 08/29/2019  . Snores 08/29/2019    Past Surgical History:  Procedure Laterality Date  . TONSILLECTOMY         No family history on file.  Social History   Tobacco Use  . Smoking status: Current Every Day Smoker    Packs/day: 0.25    Types: Cigarettes  . Smokeless tobacco: Never Used  Substance Use Topics  . Alcohol use: No  . Drug use: No    Home Medications Prior to Admission medications   Medication Sig Start Date End Date Taking? Authorizing Provider  citalopram (CELEXA) 20 MG tablet Take 1 tablet (20 mg total) by mouth daily. 01/23/20  Yes Dickie La, MD  pantoprazole (PROTONIX) 40 MG tablet Take 1 tablet (40 mg total) by mouth daily. 01/23/20  Yes Dickie La, MD    Allergies    Patient has no known allergies.  Review of Systems   Review of Systems  All other systems reviewed and are  negative.   Physical Exam Updated Vital Signs BP 111/77   Pulse 78   Temp 98.9 F (37.2 C) (Oral)   Resp 15   Ht '5\' 6"'  (1.676 m)   Wt 100.7 kg   SpO2 97%   BMI 35.83 kg/m   Physical Exam Vitals and nursing note reviewed.  Constitutional:      General: He is not in acute distress.    Appearance: He is well-developed. He is not ill-appearing, toxic-appearing or diaphoretic.  HENT:     Head: Normocephalic and atraumatic.     Right Ear: External ear normal.     Left Ear: External ear normal.     Mouth/Throat:     Pharynx: No pharyngeal swelling or oropharyngeal exudate.  Eyes:     Conjunctiva/sclera: Conjunctivae normal.     Pupils: Pupils are equal, round, and reactive to light.  Neck:     Trachea: Phonation normal.  Cardiovascular:     Rate and Rhythm: Normal rate and regular rhythm.     Heart sounds: Normal heart sounds.  Pulmonary:     Effort: Pulmonary effort is normal.     Breath sounds: Normal breath sounds.  Abdominal:     General: There is no distension.     Palpations: Abdomen is soft.     Tenderness: There is no abdominal tenderness.  Musculoskeletal:        General: Normal range of motion.     Cervical back: Normal range of motion and neck supple.  Lymphadenopathy:     Cervical: No cervical adenopathy.  Skin:    General: Skin is warm and dry.  Neurological:     Mental Status: He is alert and oriented to person, place, and time.     Cranial Nerves: No cranial nerve deficit.     Sensory: No sensory deficit.     Motor: No abnormal muscle tone.     Coordination: Coordination normal.  Psychiatric:        Mood and Affect: Mood normal.        Behavior: Behavior normal.        Thought Content: Thought content normal.        Judgment: Judgment normal.     ED Results / Procedures / Treatments   Labs (all labs ordered are listed, but only abnormal results are displayed) Labs Reviewed  BASIC METABOLIC PANEL - Abnormal; Notable for the following  components:      Result Value   Glucose, Bld 100 (*)    Creatinine, Ser 1.28 (*)    All other components within normal limits  SARS CORONAVIRUS 2 (TAT 6-24 HRS)  CBC  POC SARS CORONAVIRUS 2 AG -  ED  TROPONIN I (HIGH SENSITIVITY)  TROPONIN I (HIGH SENSITIVITY)    EKG EKG Interpretation  Date/Time:  Thursday February 07 2020 08:20:39 EDT Ventricular Rate:  101 PR Interval:  160 QRS Duration: 94 QT Interval:  332 QTC Calculation: 430 R Axis:   91 Text Interpretation: Sinus tachycardia Rightward axis Possible Anterior infarct , age undetermined Abnormal ECG Since last tracing rate faster Confirmed by Daleen Bo [phone]) on 02/07/2020 9:39:33 AM   Radiology DG Chest 2 View  Result Date: 02/07/2020 CLINICAL DATA:  Shortness of breath. EXAM: CHEST - 2 VIEW COMPARISON:  August 31, 2019. FINDINGS: The heart size and mediastinal contours are within normal limits. Both lungs are clear. No pneumothorax or pleural effusion is noted. The visualized skeletal structures are unremarkable. IMPRESSION: No active cardiopulmonary disease. Electronically Signed   By: Marijo Conception M.D.   On: 02/07/2020 08:46    Procedures Procedures (including critical care time)  Medications Ordered in ED Medications - No data to display  ED Course  I have reviewed the triage vital signs and the nursing notes.  Pertinent labs & imaging results that were available during my care of the patient were reviewed by me and considered in my medical decision making (see chart for details).  Clinical Course as of Feb 07 1155  Thu Feb 07, 2020  4332 No infiltrate or CHF, interpreted by me  DG Chest 2 View [EW]  1120 Normal except creatinine elevated  Basic metabolic panel(!) [EW]  9518 Normal antigen test  POC SARS Coronavirus 2 Ag-ED - Nasal Swab (BD Veritor Kit) [EW]  1121 Normal  Troponin I (High Sensitivity) [EW]  1136 Normal  CBC [EW]  1136 Unchanged normal delta troponin  Troponin I (High Sensitivity)  [EW]    Clinical Course User Index [EW] Daleen Bo, MD   MDM Rules/Calculators/A&P                       Patient Vitals for the past 24 hrs:  BP Temp Temp src Pulse Resp SpO2 Height Weight  02/07/20 1130 111/77 -- -- 78 15 97 % -- --  02/07/20 1100 (!)  123/92 -- -- 78 16 96 % -- --  02/07/20 1030 (!) 126/96 -- -- 78 14 98 % -- --  02/07/20 0923 138/87 -- -- -- 16 -- -- --  02/07/20 0811 (!) 126/103 98.9 F (37.2 C) Oral (!) 102 16 99 % '5\' 6"'  (1.676 m) 100.7 kg    11:40 AM Reevaluation with update and discussion. After initial assessment and treatment, an updated evaluation reveals he is comfortable has no further complaints.  Findings discussed and questions answered. Daleen Bo   Medical Decision Making:  This patient is presenting for evaluation of an acute illness which is indicative of infection with virus or bacteria, which does require a range of treatment options, and is a complaint that involves a moderate risk of morbidity and mortality. The differential diagnoses include COVID-19 infection, nonspecific viral illness, bacteremia, sepsis. I decided  to review old records, and in summary healthy individual who smokes cigarettes, has a history of GERD and obstructive sleep apnea.  He is receiving ongoing treatment for depression and anxiety. I did not require additional historical information from anyone. Clinical Laboratory Tests Ordered, included COVID-19 antigen testing, blood testing for cardiac injury, and screening for infectious and electrolyte abnormalities.  Covid antigen test is negative, PCR 24-hour test sent.  CBC and BMP are essentially normal other than mild elevation of creatinine. Radiologic Tests Ordered, included chest x-ray. I independently Visualized: Radiographic images, which show normal chest x-ray; Cardiac Monitor Tracing which shows normal sinus rhythm.  ALISON BREEDING was evaluated in Emergency Department on 02/07/2020 for the symptoms described  in the history of present illness. He was evaluated in the context of the global COVID-19 pandemic, which necessitated consideration that the patient might be at risk for infection with the SARS-CoV-2 virus that causes COVID-19. Institutional protocols and algorithms that pertain to the evaluation of patients at risk for COVID-19 are in a state of rapid change based on information released by regulatory bodies including the CDC and federal and state organizations. These policies and algorithms were followed during the patient's care in the ED.   Critical Interventions-clinical evaluation comprehensive testing with radiography, blood work, screening for COVID-19.  After These Interventions, the Patient was reevaluated and was found stable, without additional complaints.  No indication for hospitalization at this time.  Suspect nonspecific viral process, with rule out for COVID-19 infection in process.  No oxygen treatment requirement.  No indication for hospitalization or acute intervention at this time based on the current available data.  CRITICAL CARE-no Performed by: Daleen Bo   Nursing Notes Reviewed/ Care Coordinated Applicable Imaging Reviewed Interpretation of Laboratory Data incorporated into ED treatment  The patient appears reasonably screened and/or stabilized for discharge and I doubt any other medical condition or other [facility] requiring further screening, evaluation, or treatment in the ED at this time prior to discharge.  Plan: Home Medications-symptomatic over-the-counter; Home Treatments-gradual advance diet; return here if the recommended treatment, does not improve the symptoms; Recommended follow up-PCP, as needed   Final Clinical Impression(s) / ED Diagnoses Final diagnoses:  Viral illness    Rx / DC Orders ED Discharge Orders    None       Daleen Bo, MD 02/07/20 1156

## 2020-02-07 NOTE — ED Triage Notes (Signed)
Pt reports sob, emesis and diarrhea since last night. Pt a.o, resp e.u

## 2020-02-07 NOTE — Discharge Instructions (Addendum)
The testing does not show any serious problems, at this time.  The initial Covid antigen test was negative.  We sent a 24-hour test which will be back by tomorrow.  In the meantime, try to stay away from people, in case you do have COVID-19 infection.  You appear to be a little bit dehydrated so drink lots of water.  Use Tylenol if needed for fever or pain.  Start with a clear liquid diet then gradually advance to regular foods after a day or 2.  You can use Kaopectate, or Imodium, if needed for diarrhea

## 2020-02-27 ENCOUNTER — Ambulatory Visit (INDEPENDENT_AMBULATORY_CARE_PROVIDER_SITE_OTHER): Payer: 59 | Admitting: Family Medicine

## 2020-02-27 ENCOUNTER — Encounter: Payer: Self-pay | Admitting: Family Medicine

## 2020-02-27 ENCOUNTER — Other Ambulatory Visit: Payer: Self-pay

## 2020-02-27 DIAGNOSIS — F418 Other specified anxiety disorders: Secondary | ICD-10-CM | POA: Diagnosis not present

## 2020-02-27 DIAGNOSIS — G4733 Obstructive sleep apnea (adult) (pediatric): Secondary | ICD-10-CM

## 2020-02-27 DIAGNOSIS — F17200 Nicotine dependence, unspecified, uncomplicated: Secondary | ICD-10-CM | POA: Diagnosis not present

## 2020-02-28 ENCOUNTER — Encounter: Payer: Self-pay | Admitting: Family Medicine

## 2020-02-28 NOTE — Assessment & Plan Note (Signed)
precontemplative stage counseling

## 2020-02-28 NOTE — Assessment & Plan Note (Signed)
CPAP use again emphasized

## 2020-02-28 NOTE — Progress Notes (Signed)
    CHIEF COMPLAINT / HPI:  1. 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 Having difficulty at work focusing and is getting "points" taken off every time he comes for doctors appt. Had been working from home and that was better. With return to office he is less focused, some occasional panic symptoms. Wants to consider FMLA for a moth so he can get further into his treatment (meds, counseling, execise). Otherwise he is not sure he can continue to work. 2.OSA: CPAP set up. No problems   PERTINENT  PMH / PSH: I have reviewed the patient's medications, allergies, past medical and surgical history, smoking status and updated in the EMR as appropriate.   OBJECTIVE:  BP 118/82   Pulse 93   Ht 5\' 6"  (1.676 m)   Wt 220 lb 12.8 oz (100.2 kg)   SpO2 98%   BMI 35.64 kg/m  GENERAL: Well developed, well nourished and no acute distress. RESPIRATORY: respiratory rate and effort normal CARDIOVASCULAR: RRR. MSK: Normal gait, normal muscle bulk and tone.  PSYCH: Alert and oriented. Normal speech fluency and content. Asks and answers questions appropriately. No agitation noted. NEURO: no gross focal motor deficits are noted.   ASSESSMENT / PLAN:   Anxious depression Continue citalopram FMLA discussed and filled out  OSA (obstructive sleep apnea) CPAP use again emphasized  Smoker precontemplative stage counseling   MD

## 2020-02-28 NOTE — Assessment & Plan Note (Signed)
Continue citalopram FMLA discussed and filled out

## 2020-02-29 ENCOUNTER — Ambulatory Visit: Payer: 59 | Admitting: Psychology

## 2020-03-03 ENCOUNTER — Telehealth: Payer: Self-pay | Admitting: Psychology

## 2020-03-03 NOTE — Telephone Encounter (Signed)
Attempted to call pt about missed apt.  VM full.

## 2020-03-05 ENCOUNTER — Telehealth: Payer: Self-pay | Admitting: Family Medicine

## 2020-03-05 NOTE — Telephone Encounter (Signed)
Pt requesting Dr. Jennette Kettle to call him; it is very important [phone]

## 2020-03-05 NOTE — Telephone Encounter (Signed)
Informed pt that we were unable to send the information via MyChart and unable to fax to a work number without a written consent.  Informed him that I would have the form upfront for him to pick up, he said that Sydnee Cabal will be picking this up.Alyna Stensland Zimmerman Rumple, CMA

## 2020-03-25 ENCOUNTER — Telehealth: Payer: Self-pay

## 2020-03-25 NOTE — Telephone Encounter (Signed)
Patient calls nurse line stating PCP is to be receiving disability forms on patient. Patient states he believes these forms were sent last week. I checked PCP box, I did not see them. I will check with PCP.

## 2020-03-26 NOTE — Telephone Encounter (Signed)
Forms completed and on your desk Please copy for chart THANKS! Denny Levy

## 2020-03-27 NOTE — Telephone Encounter (Signed)
Patient contacted and informed of form ready for pick up.   A copy was placed in batch scanning.  

## 2020-04-02 ENCOUNTER — Other Ambulatory Visit: Payer: Self-pay

## 2020-04-02 ENCOUNTER — Emergency Department (HOSPITAL_BASED_OUTPATIENT_CLINIC_OR_DEPARTMENT_OTHER)
Admission: EM | Admit: 2020-04-02 | Discharge: 2020-04-02 | Disposition: A | Discharge status: Home / Self Care | Payer: 59 | Attending: Emergency Medicine | Admitting: Emergency Medicine

## 2020-04-02 DIAGNOSIS — Z5321 Procedure and treatment not carried out due to patient leaving prior to being seen by health care provider: Secondary | ICD-10-CM | POA: Diagnosis not present

## 2020-04-02 DIAGNOSIS — H5789 Other specified disorders of eye and adnexa: Secondary | ICD-10-CM | POA: Insufficient documentation

## 2020-04-09 ENCOUNTER — Telehealth: Payer: Self-pay | Admitting: Family Medicine

## 2020-04-09 NOTE — Telephone Encounter (Incomplete)
Back to work form dropped off for at front desk for completion.  Verified that patient section of form has been completed.  Last DOS/WCC with PCP was ***.  Placed form in team folder to be completed by clinical staff.  Grayce Fredda Hammed

## 2020-04-09 NOTE — Telephone Encounter (Signed)
Return to work Patients states URGENT form dropped off for at front desk for completion.  Verified that patient section of form has been completed.  Last DOS/WCC with PCP was 02/27/2020 Placed form in team folder to be completed by clinical staff.  Peter Key

## 2020-04-09 NOTE — Telephone Encounter (Signed)
Clinical info completed on return to work form.  Place form in Dr Donnetta Hail box for completion.  Sunday Spillers, CMA

## 2020-04-09 NOTE — Telephone Encounter (Signed)
LMOVM lettign pt know the following:  Faxed to number provided and original placed up front for pickup ( as indicated on coversheet)  Also placed copy in batch scanning. Jone Baseman, CMA

## 2020-04-09 NOTE — Telephone Encounter (Signed)
Form filled out and given to RN team. They will copy for chart as well\ Peter Key

## 2020-07-08 ENCOUNTER — Other Ambulatory Visit: Payer: Self-pay | Admitting: Family Medicine

## 2020-09-03 ENCOUNTER — Encounter: Payer: Self-pay | Admitting: Family Medicine

## 2020-09-04 ENCOUNTER — Telehealth: Payer: Self-pay | Admitting: Family Medicine

## 2020-09-04 NOTE — Telephone Encounter (Signed)
Dear Peter Key Team How do I get HH to set up a CPAP for Peter Key. His sleep study was in [place] of 2021 so itis current. His instructions are: Trial of CPAP therapy on 14 cm H2O or autopap 10-18. - Patient used a Medium size Resmed Full Face Mask AirFit F10 mask and heated humidification.  Do you just call HH or do I send in an order? I should know this.

## 2020-09-08 NOTE — Telephone Encounter (Signed)
Sent community message to [employer] about this.Taron Conrey Zimmerman Rumple, CMA

## 2020-09-12 NOTE — Telephone Encounter (Signed)
Received the below message from [employer] about this order.  Routing to PCP as an Financial planner.   Stefanie Libel Rumple, Nichelle Renwick D, CMA; Caroleen Hamman Received.  We made several attempts to schedule patient when initial order request was received but was unable to do so. Patient canceled order on last attempt. I re-entered this order and have it to the processing team. We will work to get this set up with patient.   Thanks,   [employer]

## 2020-09-22 ENCOUNTER — Emergency Department (HOSPITAL_BASED_OUTPATIENT_CLINIC_OR_DEPARTMENT_OTHER): Payer: 59

## 2020-09-22 ENCOUNTER — Emergency Department (HOSPITAL_BASED_OUTPATIENT_CLINIC_OR_DEPARTMENT_OTHER)
Admission: EM | Admit: 2020-09-22 | Discharge: 2020-09-22 | Disposition: A | Discharge status: Home / Self Care | Payer: 59 | Attending: Emergency Medicine | Admitting: Emergency Medicine

## 2020-09-22 ENCOUNTER — Other Ambulatory Visit: Payer: Self-pay

## 2020-09-22 ENCOUNTER — Encounter (HOSPITAL_BASED_OUTPATIENT_CLINIC_OR_DEPARTMENT_OTHER): Payer: Self-pay | Admitting: Emergency Medicine

## 2020-09-22 DIAGNOSIS — R0602 Shortness of breath: Secondary | ICD-10-CM | POA: Diagnosis not present

## 2020-09-22 DIAGNOSIS — Z5321 Procedure and treatment not carried out due to patient leaving prior to being seen by health care provider: Secondary | ICD-10-CM | POA: Diagnosis not present

## 2020-09-22 DIAGNOSIS — R002 Palpitations: Secondary | ICD-10-CM | POA: Insufficient documentation

## 2020-09-22 DIAGNOSIS — R079 Chest pain, unspecified: Secondary | ICD-10-CM | POA: Diagnosis not present

## 2020-09-22 NOTE — ED Notes (Signed)
Called x 3 with no answer.

## 2020-09-22 NOTE — ED Triage Notes (Signed)
Patient presents with complaints of left side chest pain; states onset last night; states pain woke him up. States pain has been intermittent throughout today with palpitations and shortness of breath.

## 2020-10-21 ENCOUNTER — Other Ambulatory Visit: Payer: Self-pay

## 2020-10-21 ENCOUNTER — Emergency Department (HOSPITAL_BASED_OUTPATIENT_CLINIC_OR_DEPARTMENT_OTHER)
Admission: EM | Admit: 2020-10-21 | Discharge: 2020-10-21 | Disposition: A | Discharge status: Home / Self Care | Payer: 59 | Attending: Emergency Medicine | Admitting: Emergency Medicine

## 2020-10-21 ENCOUNTER — Encounter (HOSPITAL_BASED_OUTPATIENT_CLINIC_OR_DEPARTMENT_OTHER): Payer: Self-pay | Admitting: *Deleted

## 2020-10-21 DIAGNOSIS — J069 Acute upper respiratory infection, unspecified: Secondary | ICD-10-CM | POA: Diagnosis not present

## 2020-10-21 DIAGNOSIS — J45909 Unspecified asthma, uncomplicated: Secondary | ICD-10-CM | POA: Diagnosis not present

## 2020-10-21 DIAGNOSIS — R509 Fever, unspecified: Secondary | ICD-10-CM | POA: Diagnosis present

## 2020-10-21 DIAGNOSIS — F17210 Nicotine dependence, cigarettes, uncomplicated: Secondary | ICD-10-CM | POA: Diagnosis not present

## 2020-10-21 DIAGNOSIS — U071 COVID-19: Secondary | ICD-10-CM | POA: Insufficient documentation

## 2020-10-21 LAB — RAPID INFLUENZA A&B ANTIGENS
Influenza A (ARMC): NEGATIVE
Influenza B (ARMC): NEGATIVE

## 2020-10-21 MED ORDER — BENZONATATE 100 MG PO CAPS: 100.0000 mg | ORAL_CAPSULE | Freq: Three times a day (TID) | ORAL | 0 refills | Status: AC

## 2020-10-21 MED ORDER — FLUTICASONE PROPIONATE 50 MCG/ACT NA SUSP: 2.0000 | Freq: Every day | NASAL | 0 refills | Status: DC

## 2020-10-21 MED ORDER — ACETAMINOPHEN 325 MG PO TABS
650.0000 mg | ORAL_TABLET | Freq: Once | ORAL | Status: AC
  Administered 2020-10-21: 650 mg via ORAL
  Filled 2020-10-21: qty 2

## 2020-10-21 MED ORDER — OSELTAMIVIR PHOSPHATE 75 MG PO CAPS: 75.0000 mg | ORAL_CAPSULE | Freq: Two times a day (BID) | ORAL | 0 refills | Status: DC

## 2020-10-21 NOTE — Discharge Instructions (Addendum)
Take the flonase for nasal congestion and tessalon for cough.   Take tylenol and motrin for fevers and body aches.   You were tested for Covid and influenza.  If your influenza test is positive you should fill the prescription for Tamiflu.  If it is negative then you do not need to fill the prescription for Tamiflu.  If your Covid test is positive you will need to quarantine.  Please make an appointment to follow-up with your regular doctor in 1 week and return to the ED for new or worsening symptoms in the meantime.

## 2020-10-21 NOTE — ED Triage Notes (Signed)
Fever, body aches, sob, cough since yesterday. No fever reducer today.

## 2020-10-21 NOTE — ED Provider Notes (Signed)
[facility] EMERGENCY DEPARTMENT Provider Note   CSN: [id number] Arrival date & time: 10/21/20  1710     History No chief complaint on file.   Peter Key is a 29 y.o. male.  HPI   29 year old male with a history of anxiety, asthma, OSA, who presents to the emergency department today for evaluation of URI symptoms.  States that for the last 24 hours he has had a fever, body aches, cough, congestion.  He denies any sore throat or shortness of breath.  Denies any vomiting or diarrhea.  Denies any known sick contacts.  He has been fully vaccinated against Covid.  Past Medical History:  Diagnosis Date  . Anxiety   . Asthma     Patient Active Problem List   Diagnosis Date Noted  . OSA (obstructive sleep apnea) 01/15/2020  . 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 Smoker 08/29/2019  . GERD (gastroesophageal reflux disease) 08/29/2019  . Snores 08/29/2019    Past Surgical History:  Procedure Laterality Date  . TONSILLECTOMY         No family history on file.  Social History   Tobacco Use  . Smoking status: Current Every Day Smoker    Packs/day: 0.25    Types: Cigarettes  . Smokeless tobacco: Never Used  Vaping Use  . Vaping Use: Never used  Substance Use Topics  . Alcohol use: No  . Drug use: No    Home Medications Prior to Admission medications   Medication Sig Start Date End Date Taking? Authorizing Provider  benzonatate (TESSALON) 100 MG capsule Take 1 capsule (100 mg total) by mouth every 8 (eight) hours for 5 days. 10/21/20 10/26/20 Yes Izaak Sahr S, PA-C  citalopram (CELEXA) 20 MG tablet Take 1 tablet (20 mg total) by mouth daily. 01/23/20  Yes Nestor Ramp, MD  fluticasone (FLONASE) 50 MCG/ACT nasal spray Place 2 sprays into both nostrils daily. 10/21/20  Yes Oval Moralez S, PA-C  omeprazole (PRILOSEC) 20 MG capsule TAKE 1 CAPSULE BY MOUTH EVERY DAY 07/09/20  Yes Nestor Ramp, MD  oseltamivir (TAMIFLU) 75 MG capsule Take 1 capsule (75 mg  total) by mouth every 12 (twelve) hours. 10/21/20  Yes Jamesha Ellsworth S, PA-C  pantoprazole (PROTONIX) 40 MG tablet Take 1 tablet (40 mg total) by mouth daily. 01/23/20  Yes Nestor Ramp, MD    Allergies    Patient has no known allergies.  Review of Systems   Review of Systems  Constitutional: Positive for chills, diaphoresis, fatigue and fever.  HENT: Positive for congestion. Negative for ear pain and sore throat.   Eyes: Negative for pain and visual disturbance.  Respiratory: Positive for cough. Negative for shortness of breath.   Cardiovascular: Negative for leg swelling.  Gastrointestinal: Negative for abdominal pain, constipation, diarrhea, nausea and vomiting.  Genitourinary: Negative for dysuria and hematuria.  Musculoskeletal: Positive for myalgias. Negative for back pain.  Skin: Negative for color change and rash.  Neurological: Positive for headaches.  All other systems reviewed and are negative.   Physical Exam Updated Vital Signs BP 120/79 (BP Location: Right Arm)   Pulse (!) 101   Temp 99.4 F (37.4 C) (Oral)   Resp 18   Ht 5\' 6"  (1.676 m)   Wt 100.9 kg   SpO2 100%   BMI 35.91 kg/m   Physical Exam Vitals and nursing note reviewed.  Constitutional:      Appearance: He is well-developed and well-nourished.  HENT:     Head: Normocephalic  and atraumatic.  Eyes:     Conjunctiva/sclera: Conjunctivae normal.  Cardiovascular:     Rate and Rhythm: Normal rate and regular rhythm.     Heart sounds: Normal heart sounds. No murmur heard.   Pulmonary:     Effort: Pulmonary effort is normal. No respiratory distress.     Breath sounds: Normal breath sounds. No wheezing, rhonchi or rales.  Abdominal:     General: Bowel sounds are normal.     Palpations: Abdomen is soft.     Tenderness: There is no abdominal tenderness.  Musculoskeletal:        General: No edema.     Cervical back: Neck supple.  Skin:    General: Skin is warm and dry.  Neurological:     Mental  Status: He is alert.  Psychiatric:        Mood and Affect: Mood and affect normal.     ED Results / Procedures / Treatments   Labs (all labs ordered are listed, but only abnormal results are displayed) Labs Reviewed  SARS CORONAVIRUS 2 (TAT 6-24 HRS)  RAPID INFLUENZA A&B ANTIGENS    EKG None  Radiology No results found.  Procedures Procedures (including critical care time)  Medications Ordered in ED Medications  acetaminophen (TYLENOL) tablet 650 mg (650 mg Oral Given 10/21/20 1733)    ED Course  I have reviewed the triage vital signs and the nursing notes.  Pertinent labs & imaging results that were available during my care of the patient were reviewed by me and considered in my medical decision making (see chart for details).    MDM Rules/Calculators/A&P                          Patient presenting for evaluation for Covid.  Reports symptoms ongoing for 1 days.  Patient nontoxic, well-appearing, no distress. Pt initially tachycardic and febrile but VS improved after antipyretics.  Tested for Covid in the ED. Results pending upon d/c also tested for flu and pending at d/c.  Patient has a my chart and agrees to follow-up on the results.  If positive for flu, Rx for Tamiflu given.  If negative he is given medications for symptomatic management including Tessalon for cough and Flonase for nasal congestion.. Advised on quarantine measures is positive. Advised on f/u and return precautions. Pt voiced understanding of the plan and reasons to return. All questions answered, pt stable for d/c.  Peter Key was evaluated in Emergency Department on 10/21/2020 for the symptoms described in the history of present illness. He was evaluated in the context of the global COVID-19 pandemic, which necessitated consideration that the patient might be at risk for infection with the SARS-CoV-2 virus that causes COVID-19. Institutional protocols and algorithms that pertain to the evaluation of  patients at risk for COVID-19 are in a state of rapid change based on information released by regulatory bodies including the CDC and federal and state organizations. These policies and algorithms were followed during the patient's care in the ED.   Final Clinical Impression(s) / ED Diagnoses Final diagnoses:  Viral upper respiratory tract infection    Rx / DC Orders ED Discharge Orders         Ordered    benzonatate (TESSALON) 100 MG capsule  Every 8 hours        10/21/20 2139    fluticasone (FLONASE) 50 MCG/ACT nasal spray  Daily        10/21/20 2139  oseltamivir (TAMIFLU) 75 MG capsule  Every 12 hours        10/21/20 2139           Rayne Du 10/21/20 2140    Charlynne Pander, MD 10/21/20 [phone]

## 2020-10-22 LAB — SARS CORONAVIRUS 2 (TAT 6-24 HRS): SARS Coronavirus 2: POSITIVE — AB

## 2020-11-12 ENCOUNTER — Telehealth (INDEPENDENT_AMBULATORY_CARE_PROVIDER_SITE_OTHER): Payer: 59 | Admitting: Family Medicine

## 2020-11-12 ENCOUNTER — Encounter: Payer: 59 | Admitting: Family Medicine

## 2020-11-12 ENCOUNTER — Other Ambulatory Visit: Payer: Self-pay

## 2020-11-12 DIAGNOSIS — R0789 Other chest pain: Secondary | ICD-10-CM

## 2020-11-12 DIAGNOSIS — Z720 Tobacco use: Secondary | ICD-10-CM

## 2020-11-12 DIAGNOSIS — G4733 Obstructive sleep apnea (adult) (pediatric): Secondary | ICD-10-CM | POA: Diagnosis not present

## 2020-11-12 MED ORDER — FLUTICASONE PROPIONATE HFA 220 MCG/ACT IN AERO: 1.0000 | INHALATION_SPRAY | Freq: Two times a day (BID) | RESPIRATORY_TRACT | 1 refills | Status: DC

## 2020-11-13 ENCOUNTER — Encounter: Payer: Self-pay | Admitting: Family Medicine

## 2020-11-13 DIAGNOSIS — R0789 Other chest pain: Secondary | ICD-10-CM | POA: Insufficient documentation

## 2020-11-13 DIAGNOSIS — Z720 Tobacco use: Secondary | ICD-10-CM | POA: Insufficient documentation

## 2020-11-13 NOTE — Assessment & Plan Note (Addendum)
Slightly atypical chest discomfort.  This does not seem cardiac at all.  He did have COVID so I guess the differential should include pulmonary embolus but he does not really seem to fit with his chest pain pattern.  I think it fits more with some postinfectious inflammation/bronchospasm.  I think a good indicator of this is that he gets some improvement with the CPAP.  We will try him on inhaled steroid inhaler for the next few weeks.  He has follow-up with me next week which is fortunate.  If he has worsening symptoms in the interim, he will let me know.

## 2020-11-13 NOTE — Assessment & Plan Note (Signed)
Stop smoking when he got COVID.  We discussed.  Congratulated.  We will see how he is doing with this.

## 2020-11-13 NOTE — Assessment & Plan Note (Signed)
He has finally gotten his CPAP machine and it sounds like it is working really well.  He is also stopped smoking and I congratulated him on that.

## 2020-11-13 NOTE — Progress Notes (Signed)
[facility] Telemedicine Visit  Patient consented to have virtual visit and was identified by name and date of birth. Method of visit: Video  Encounter participants: Patient: Peter Key - located  at his job. Provider: Denny Levy - located at office Others (if applicable): None  Chief Complaint: Chest pressure, obstructive sleep apnea and treatment, smoking cessation  HPI:  #1. Since he had COVID a few weeks ago he is had most of his symptoms resolved.  He still has occasional cough but is nonproductive.  The symptom that is worrying him is a sensation of tightness in his chest.  It started with his COVID infection and has just not resolved.  He notices it particularly when he is exerting himself such as at work.  It worries him quite a bit and the more he thinks about it more he notices it.  He has not had dizziness.  He can continue to do work despite the fact that it feels somewhat tight in his chest.  He went to the emergency department the other day but left before being seen. 2.  Regarding obstructive sleep apnea: He has finally gotten his CPAP machine set up and says that is working quite well.  He is still acclimating to it some.  He notes that it seems to help the tightness sensation in his his chest when he turns it on and uses it at night.  The tightness sensation goes away during that period of time. #3.  Tobacco use: He stopped when he got sick with COVID and said he has not restarted.  He is not having a lot of cravings.  He is very happy about this.  ROS: per HPI  Pertinent PMHx: Obstructive sleep apnea, recent COVID infection  Exam:  There were no vitals taken for this visit.  Respiratory: Normal respiratory effort viewed through the video.  No cough. PSYCH: AxOx4. Good eye contact.. No psychomotor retardation or agitation. Appropriate speech fluency and content. Asks and answers questions appropriately. Mood is  congruent.   Assessment/Plan:  Chest discomfort Slightly atypical chest discomfort.  This does not seem cardiac at all.  He did have COVID so I guess the differential should include pulmonary embolus but he does not really seem to fit with his chest pain pattern.  I think it fits more with some postinfectious inflammation/bronchospasm.  I think a good indicator of this is that he gets some improvement with the CPAP.  We will try him on inhaled steroid inhaler for the next few weeks.  He has follow-up with me next week which is fortunate.  If he has worsening symptoms in the interim, he will let me know.  OSA (obstructive sleep apnea) He has finally gotten his CPAP machine and it sounds like it is working really well.  He is also stopped smoking and I congratulated him on that.  Tobacco use Stop smoking when he got COVID.  We discussed.  Congratulated.  We will see how he is doing with this.    Time spent during visit with patient: 30 minutes

## 2020-11-18 ENCOUNTER — Emergency Department (HOSPITAL_BASED_OUTPATIENT_CLINIC_OR_DEPARTMENT_OTHER): Payer: 59

## 2020-11-18 ENCOUNTER — Emergency Department (HOSPITAL_BASED_OUTPATIENT_CLINIC_OR_DEPARTMENT_OTHER)
Admission: EM | Admit: 2020-11-18 | Discharge: 2020-11-19 | Disposition: A | Discharge status: Home / Self Care | Payer: 59 | Attending: Emergency Medicine | Admitting: Emergency Medicine

## 2020-11-18 ENCOUNTER — Other Ambulatory Visit: Payer: Self-pay

## 2020-11-18 ENCOUNTER — Encounter (HOSPITAL_BASED_OUTPATIENT_CLINIC_OR_DEPARTMENT_OTHER): Payer: Self-pay

## 2020-11-18 DIAGNOSIS — R0602 Shortness of breath: Secondary | ICD-10-CM | POA: Diagnosis not present

## 2020-11-18 DIAGNOSIS — J45909 Unspecified asthma, uncomplicated: Secondary | ICD-10-CM | POA: Insufficient documentation

## 2020-11-18 DIAGNOSIS — Z87891 Personal history of nicotine dependence: Secondary | ICD-10-CM | POA: Diagnosis not present

## 2020-11-18 DIAGNOSIS — U099 Post covid-19 condition, unspecified: Secondary | ICD-10-CM | POA: Insufficient documentation

## 2020-11-18 DIAGNOSIS — Z7951 Long term (current) use of inhaled steroids: Secondary | ICD-10-CM | POA: Insufficient documentation

## 2020-11-18 DIAGNOSIS — R059 Cough, unspecified: Secondary | ICD-10-CM | POA: Diagnosis not present

## 2020-11-18 MED ORDER — ALBUTEROL SULFATE HFA 108 (90 BASE) MCG/ACT IN AERS
2.0000 | INHALATION_SPRAY | RESPIRATORY_TRACT | Status: DC | PRN
  Administered 2020-11-18: 2 via RESPIRATORY_TRACT
  Filled 2020-11-18: qty 6.7

## 2020-11-18 MED ORDER — AEROCHAMBER PLUS W/MASK MISC
1.0000 | Freq: Once | Status: AC
  Administered 2020-11-18: 1
  Filled 2020-11-18: qty 1

## 2020-11-18 NOTE — ED Triage Notes (Signed)
Pt c/o chest pressure & sob. Complains of cough and feeling sick for the past week. Had COVID in December.

## 2020-11-18 NOTE — ED Provider Notes (Signed)
[facility] Provider Note: Lowella Dell, MD, FACEP  CSN: [id number] MRN: [id number] ARRIVAL: 11/18/20 at 1751 ROOM: [facility]2/[facility]   CHIEF COMPLAINT  Shortness of Breath   HISTORY OF PRESENT ILLNESS  11/18/20 11:23 PM Peter Key is a 30 y.o. male who had Covid 1 month ago.  For about the past week he has noted difficulty taking a deep breath.  He he feels like a burning in his chest and a squeezing sensation, worse with exertion.  Symptoms are moderate.  He has not had a fever.  He has had a dry cough.  He was prescribed an inhaler which she was instructed to use twice daily.  He was given a Flovent inhaler which has not adequately treated his symptoms.  He has sleep apnea and uses CPAP at night.  He has become less tolerant of the CPAP over the past week.   Past Medical History:  Diagnosis Date  . Anxiety   . Asthma     Past Surgical History:  Procedure Laterality Date  . TONSILLECTOMY      History reviewed. No pertinent family history.  Social History   Tobacco Use  . Smoking status: Former Smoker    Packs/day: 0.25    Types: Cigarettes    Quit date: 10/18/2020    Years since quitting: 0.0  . Smokeless tobacco: Never Used  Vaping Use  . Vaping Use: Never used  Substance Use Topics  . Alcohol use: No  . Drug use: No    Prior to Admission medications   Medication Sig Start Date End Date Taking? Authorizing Provider  citalopram (CELEXA) 20 MG tablet Take 1 tablet (20 mg total) by mouth daily. 01/23/20   Nestor Ramp, MD  fluticasone (FLONASE) 50 MCG/ACT nasal spray Place 2 sprays into both nostrils daily. 10/21/20   Couture, Cortni S, PA-C  fluticasone (FLOVENT HFA) 220 MCG/ACT inhaler Inhale 1 puff into the lungs in the morning and at bedtime. 11/12/20   Nestor Ramp, MD  omeprazole (PRILOSEC) 20 MG capsule TAKE 1 CAPSULE BY MOUTH EVERY DAY 07/09/20   Nestor Ramp, MD  pantoprazole (PROTONIX) 40 MG tablet Take 1 tablet (40 mg total) by mouth daily.  01/23/20   Nestor Ramp, MD    Allergies Patient has no known allergies.   REVIEW OF SYSTEMS  Negative except as noted here or in the History of Present Illness.   PHYSICAL EXAMINATION  Initial Vital Signs Blood pressure (!) 144/110, pulse 72, temperature 97.8 F (36.6 C), temperature source Oral, resp. rate 18, height 5\' 6"  (1.676 m), weight 99.8 kg, SpO2 99 %.  Examination General: Well-developed, well-nourished male in no acute distress; appearance consistent with age of record HENT: normocephalic; atraumatic; no pharyngeal erythema, edema or exudate Eyes: pupils equal, round and reactive to light; extraocular muscles intact Neck: supple Heart: regular rate and rhythm Lungs: clear to auscultation bilaterally Abdomen: soft; nondistended; nontender; bowel sounds present Extremities: No deformity; full range of motion Neurologic: Awake, alert and oriented; motor function intact in all extremities and symmetric; no facial droop Skin: Warm and dry Psychiatric: Normal mood and affect   RESULTS  Summary of this visit's results, reviewed and interpreted by myself:   EKG Interpretation  Date/Time:    Ventricular Rate:    PR Interval:    QRS Duration:   QT Interval:    QTC Calculation:   R Axis:     Text Interpretation:  Laboratory Studies: No results found for this or any previous visit (from the past 24 hour(s)). Imaging Studies: DG Chest Portable 1 View  Result Date: 11/18/2020 CLINICAL DATA:  Chest pressure, short of breath EXAM: PORTABLE CHEST 1 VIEW COMPARISON:  09/22/2020 FINDINGS: The heart size and mediastinal contours are within normal limits. Both lungs are clear. The visualized skeletal structures are unremarkable. IMPRESSION: No active disease. Electronically Signed   By: Sharlet Salina M.D.   On: 11/18/2020 18:19    ED COURSE and MDM  Nursing notes, initial and subsequent vitals signs, including pulse oximetry, reviewed and interpreted by  myself.  Vitals:   11/18/20 1756 11/18/20 1759 11/18/20 2241 11/18/20 2325  BP: (!) 130/111  (!) 144/110 (!) 140/108  Pulse: 91  72 76  Resp: 20  18 16   Temp:  98.8 F (37.1 C) 97.8 F (36.6 C)   TempSrc:  Oral Oral   SpO2: 99%  99% 100%  Weight:  99.8 kg    Height:  5\' 6"  (1.676 m)     Medications  albuterol (VENTOLIN HFA) 108 (90 Base) MCG/ACT inhaler 2 puff (has no administration in time range)  aerochamber plus with mask device 1 each (has no administration in time range)    I suspect the patient's symptoms are due to long Covid.  We will provide an albuterol inhaler and AeroChamber and instructed in their use.  If his intolerance of CPAP persists he may need a new sleep study.  PROCEDURES  Procedures   ED DIAGNOSES     ICD-10-CM   1. Long COVID  U09.9        , MD 11/18/20 [phone]

## 2020-11-19 ENCOUNTER — Ambulatory Visit (INDEPENDENT_AMBULATORY_CARE_PROVIDER_SITE_OTHER): Payer: 59 | Admitting: Family Medicine

## 2020-11-19 ENCOUNTER — Encounter: Payer: Self-pay | Admitting: Family Medicine

## 2020-11-19 ENCOUNTER — Other Ambulatory Visit: Payer: Self-pay

## 2020-11-19 DIAGNOSIS — R0789 Other chest pain: Secondary | ICD-10-CM

## 2020-11-19 NOTE — Patient Instructions (Signed)
Use the flowvent and let me see you in about 6 week. Call your Home Health company and see if they can adjust your settings a little bit. Great to see you!

## 2020-11-20 NOTE — Assessment & Plan Note (Signed)
I reviewed his x-ray with him.  I do think this is some leftover respiratory inflammation from his recent Covid infection.  He does have a remote history of asthma.  I urged him to use the Flovent inhaler as prescribed starting today and see me back in 3 or 4 weeks.  I reassured him I do not think this is cardiac in that the symptoms will improve with appropriate treatment.  Also think this is making him a little more anxious.  I think he discontinued the citalopram so we can reevaluate that at next office visit.

## 2020-11-20 NOTE — Progress Notes (Signed)
    CHIEF COMPLAINT / HPI: Continued concerns about tightness in his chest.  He ended up going to the emergency room last night where they did an evaluation and recommended albuterol.  He has picked up the Flovent inhaler but has not yet started it.  Symptoms are similar to our prior discussion.  Tightness in chest that is not particularly associated with activity but does seem compounded by anxiety as he worries quite a bit when this happens.   PERTINENT  PMH / PSH: I have reviewed the patient's medications, allergies, past medical and surgical history, smoking status and updated in the EMR as appropriate. Reviewed ER work-up and chest x-ray which was normal.  I reviewed the images with him as well.  OBJECTIVE:  BP 118/82   Pulse 78   Ht 5\' 6"  (1.676 m)   Wt 224 lb (101.6 kg)   SpO2 97%   BMI 36.15 kg/m   Vital signs reviewed. GENERAL: Well-developed, well-nourished, no acute distress. CARDIOVASCULAR: Regular rate and rhythm no murmur gallop or rub LUNGS: Clear to auscultation bilaterally, no rales or wheeze. ABDOMEN: Soft positive bowel sounds NEURO: No gross focal neurological deficits. MSK: Movement of extremity x 4.   ASSESSMENT / PLAN: 30 minutes face to face time in discussion of concerns,  potential diagnoses, treatment options.  Chest discomfort I reviewed his x-ray with him.  I do think this is some leftover respiratory inflammation from his recent Covid infection.  He does have a remote history of asthma.  I urged him to use the Flovent inhaler as prescribed starting today and see me back in 3 or 4 weeks.  I reassured him I do not think this is cardiac in that the symptoms will improve with appropriate treatment.  Also think this is making him a little more anxious.  I think he discontinued the citalopram so we can reevaluate that at next office visit.   MD

## 2021-01-07 ENCOUNTER — Ambulatory Visit (INDEPENDENT_AMBULATORY_CARE_PROVIDER_SITE_OTHER): Payer: 59 | Admitting: Family Medicine

## 2021-01-07 ENCOUNTER — Other Ambulatory Visit: Payer: Self-pay

## 2021-01-07 ENCOUNTER — Encounter: Payer: Self-pay | Admitting: Family Medicine

## 2021-01-07 VITALS — BP 136/108 | HR 91 | Ht 66.0 in | Wt 233.6 lb

## 2021-01-07 DIAGNOSIS — G4733 Obstructive sleep apnea (adult) (pediatric): Secondary | ICD-10-CM

## 2021-01-07 DIAGNOSIS — I10 Essential (primary) hypertension: Secondary | ICD-10-CM | POA: Insufficient documentation

## 2021-01-07 DIAGNOSIS — R0789 Other chest pain: Secondary | ICD-10-CM | POA: Diagnosis not present

## 2021-01-07 MED ORDER — CANDESARTAN CILEXETIL 16 MG PO TABS: 16.0000 mg | ORAL_TABLET | Freq: Every day | ORAL | 3 refills | Status: DC

## 2021-01-07 MED ORDER — PANTOPRAZOLE SODIUM 40 MG PO TBEC: 40.0000 mg | DELAYED_RELEASE_TABLET | Freq: Every day | ORAL | 3 refills | Status: DC

## 2021-01-07 NOTE — Patient Instructions (Signed)
The heart doctor (cardiologist) will call you directly for an appointment. I will send you a note about yoru labs. I have called in a new BP medicine. Let me see you  In about 4 weeks

## 2021-01-08 ENCOUNTER — Encounter: Payer: Self-pay | Admitting: Family Medicine

## 2021-01-08 LAB — COMPREHENSIVE METABOLIC PANEL
ALT: 41 IU/L (ref 0–44)
AST: 27 IU/L (ref 0–40)
Albumin/Globulin Ratio: 1.6 (ref 1.2–2.2)
Albumin: 4.4 g/dL (ref 4.1–5.2)
Alkaline Phosphatase: 98 IU/L (ref 44–121)
BUN/Creatinine Ratio: 9 (ref 9–20)
BUN: 10 mg/dL (ref 6–20)
Bilirubin Total: 0.3 mg/dL (ref 0.0–1.2)
CO2: 24 mmol/L (ref 20–29)
Calcium: 9.4 mg/dL (ref 8.7–10.2)
Chloride: 104 mmol/L (ref 96–106)
Creatinine, Ser: 1.08 mg/dL (ref 0.76–1.27)
Globulin, Total: 2.7 g/dL (ref 1.5–4.5)
Glucose: 129 mg/dL — ABNORMAL HIGH (ref 65–99)
Potassium: 4.1 mmol/L (ref 3.5–5.2)
Sodium: 140 mmol/L (ref 134–144)
Total Protein: 7.1 g/dL (ref 6.0–8.5)
eGFR: 95 mL/min/{1.73_m2} (ref >59)

## 2021-01-08 LAB — CBC
Hematocrit: 44.3 % (ref 37.5–51.0)
Hemoglobin: 15.1 g/dL (ref 13.0–17.7)
MCH: 28.8 pg (ref 26.6–33.0)
MCHC: 34.1 g/dL (ref 31.5–35.7)
MCV: 85 fL (ref 79–97)
Platelets: 283 10*3/uL (ref 150–450)
RBC: 5.24 x10E6/uL (ref 4.14–5.80)
RDW: 13.2 % (ref 11.6–15.4)
WBC: 6.1 10*3/uL (ref 3.4–10.8)

## 2021-01-08 LAB — LIPID PANEL
Chol/HDL Ratio: 3.7 ratio (ref 0.0–5.0)
Cholesterol, Total: 159 mg/dL (ref 100–199)
HDL: 43 mg/dL (ref >39)
LDL Chol Calc (NIH): 93 mg/dL (ref 0–99)
Triglycerides: 127 mg/dL (ref 0–149)
VLDL Cholesterol Cal: 23 mg/dL (ref 5–40)

## 2021-01-08 NOTE — Assessment & Plan Note (Signed)
I do not think this is typical anginal chest pain but he remains quite concerned.  We will do referral to cardiology.

## 2021-01-08 NOTE — Assessment & Plan Note (Signed)
Continue CPAP.  

## 2021-01-08 NOTE — Progress Notes (Signed)
    CHIEF COMPLAINT / HPI: Continues to have chest discomfort daily.  I had placed him on an inhaler and that seems to have helped about 10 or 20% but not resolve the issue.  Describes it is central chest area, sometimes like pressure, sometimes like gas pains.  Does not radiate.  Can occur at rest or with exertion.  He agrees it may also be brought on by emotion.  He is very concerned about this and would like to have a more formal work-up 2.  Obstructive sleep apnea: Continues to use his CPAP regularly.  Notes that his energy has been a little better since this 3.  Blood pressure elevated a few times when he had it taken recently and he has some questions about that  PERTINENT  PMH / PSH: I have reviewed the patient's medications, allergies, past medical and surgical history, smoking status and updated in the EMR as appropriate.   OBJECTIVE:  BP (!) 136/108   Pulse 91   Ht 5\' 6"  (1.676 m)   Wt 233 lb 9.6 oz (106 kg)   SpO2 96%   BMI 37.70 kg/m  Vital signs reviewed. GENERAL: Well-developed, well-nourished, no acute distress. CARDIOVASCULAR: Regular rate and rhythm no murmur gallop or rub LUNGS: Clear to auscultation bilaterally, no rales or wheeze. ABDOMEN: Soft positive bowel sounds NEURO: No gross focal neurological deficits. MSK: Movement of extremity x 4.    ASSESSMENT / PLAN:   Primary hypertension Reviewed all of his blood pressure readings for the last year.  He certainly is in the elevated range more often than not.  He agreed to start medication today.  We will get labs.  Start him on candesartan and follow-up 3 to 4 weeks.  Chest discomfort I do not think this is typical anginal chest pain but he remains quite concerned.  We will do referral to cardiology.  OSA (obstructive sleep apnea) Continue CPAP   MD

## 2021-01-08 NOTE — Assessment & Plan Note (Signed)
Reviewed all of his blood pressure readings for the last year.  He certainly is in the elevated range more often than not.  He agreed to start medication today.  We will get labs.  Start him on candesartan and follow-up 3 to 4 weeks.

## 2021-01-16 ENCOUNTER — Ambulatory Visit: Payer: 59 | Attending: Physician Assistant | Admitting: Physician Assistant

## 2021-01-16 ENCOUNTER — Other Ambulatory Visit: Payer: Self-pay

## 2021-01-16 ENCOUNTER — Ambulatory Visit (INDEPENDENT_AMBULATORY_CARE_PROVIDER_SITE_OTHER): Payer: 59 | Admitting: Physician Assistant

## 2021-01-16 ENCOUNTER — Encounter (INDEPENDENT_AMBULATORY_CARE_PROVIDER_SITE_OTHER): Payer: Self-pay | Admitting: Physician Assistant

## 2021-01-16 VITALS — BP 130/89 | HR 96 | Temp 98.9°F | Ht 66.0 in | Wt 227.0 lb

## 2021-01-16 DIAGNOSIS — J0140 Acute pansinusitis, unspecified: Secondary | ICD-10-CM

## 2021-01-16 MED ORDER — AMOXICILLIN 875 MG-POTASSIUM CLAVULANATE 125 MG TABLET
1.0000 | ORAL_TABLET | Freq: Two times a day (BID) | ORAL | 0 refills | Status: AC
Start: 2021-01-16 — End: ?

## 2021-01-16 NOTE — Nursing Note (Signed)
BP 130/89 (Site: Upper Extremity, Patient Position: Sitting, Cuff Size: Adult Large)   Pulse 96   Temp 37.2 C (98.9 F) (Tympanic)   Ht 1.676 m (5\' 6" )   Wt 103 kg (227 lb)   SpO2 96%   BMI 36.64 kg/m   , RTR  01/16/2021, 11:25

## 2021-01-16 NOTE — Progress Notes (Signed)
[facility]  URGENT CARE, [facility]  [address]  [place] [address]  Dept: [phone]  Dept Fax: [phone]  Loc: [phone]  Loc Fax: [phone]     Patient Name: Jorge Duncan  Date of Service: 01/16/21  Date of Birth: [birth year]    Chief Complaint: Cough (x 3 days), Chest Congestion, and Sore Throat      HPI: Elaine Middleton is a 30 y.o. male who presents today with:    COVID 12/21    Onset: Wednesday  COVID - Flu - Strep exposure:   None known  Cough:  Wet, clear  Nasal congestion:  Yes  Sinus pain: No  HA: No  Rhinorrhea: Yellow  Fever: No  ST:  No  EA: No  Loss smell or taste: No  Body / Muscle aches: No  SOB: Minor  Wheezing: No  N/V/D:  No  Fatigue: Minor  Vaccine: Overdue booster  Tob: No  Work: [employer]    Vital signs and initial history obtained by clinical staff.    Review of Systems:    Nine point review of systems is negative except as noted as a pertinent positive or negative in the HPI      Past Medical History:   Diagnosis Date   . Anxiety    . Hypertension        No medical or surgical past history      Family Medical History:    None         Social History     Socioeconomic History   . Marital status: Single   Tobacco Use   . Smoking status: Never Smoker   . Smokeless tobacco: Never Used   Vaping Use   . Vaping Use: Never used   Substance and Sexual Activity   . Alcohol use: Yes     Comment: socially   . Drug use: Never     Expanded Substance History     Additional history       Allergies:  No Known Allergies  Problem List:  There are no problems to display for this patient.    Medication:  Outpatient Encounter Medications as of 01/16/2021   Medication Sig Dispense Refill   . amoxicillin-pot clavulanate (AUGMENTIN) 875-125 mg Oral Tablet Take 1 Tablet by mouth Every 12 hours 20 Tablet 0   . candesartan (ATACAND) 16 mg Oral Tablet      . fluticasone propionate (FLOVENT) 220 mcg/actuation Inhalation oral inhaler Take 1 Puff by inhalation     .  pantoprazole (PROTONIX) 40 mg Oral Tablet, Delayed Release (E.C.)        No facility-administered encounter medications on file as of 01/16/2021.          Objective  Vitals: BP 130/89 (Site: Upper Extremity, Patient Position: Sitting, Cuff Size: Adult Large)   Pulse 96   Temp 37.2 C (98.9 F) (Tympanic)   Ht 1.676 m (5\' 6" )   Wt 103 kg (227 lb)   SpO2 96%   BMI 36.64 kg/m       General: appears in good health, moderately obese, appears stated age and no distress  Eyes: Pupils equal and round, reactive to light and accomodation.   HENT:ENT without erythema or injection, mucous membranes moist.  Neck: No JVD or thyromegaly or lymphadenopathy and supple, symmetrical, trachea midline  Lungs: clear to auscultation bilaterally.   Cardiovascular:    Heart regular rate and rhythm  Extremities: extremities normal, atraumatic, no cyanosis or edema  Skin: Skin warm and dry and Skin color, texture, turgor normal. No rashes or lesions  Neurologic: normal mental status, sensory, motor, cranial nerves, reflexes, coordination, and gait  Lymphatics: no lymphadenopathy      Assessment and Plan:      ICD-10-CM    1. Acute pansinusitis, recurrence not specified  J01.40      Medication Orders   Medications   . amoxicillin-pot clavulanate (AUGMENTIN) 875-125 mg Oral Tablet     Sig: Take 1 Tablet by mouth Every 12 hours     Dispense:  20 Tablet     Refill:  0       Carson Myrtle, PA-C  01/16/2021, 11:24        Portions of this note may be dictated using voice recognition software or a dictation service. Variances in spelling and vocabulary are possible and unintentional. Not all errors are caught/corrected. Please notify the Thereasa Parkin if any discrepancies are noted or if the meaning of any statement is not clear.     Patient was seen independently with attending physician available for consultation.

## 2021-01-17 LAB — POC COVID-19, FLU A/B, RSV RAPID BY PCR (RESULTS)
INFLUENZA VIRUS A, PCR 4PLEX, POC: NEGATIVE
INFLUENZA VIRUS B, PCR 4PLEX, POC: NEGATIVE
RSV, PCR 4PLEX, POC: NEGATIVE
SARS-COV-2, POC: NEGATIVE

## 2021-01-17 NOTE — Result Encounter Note (Signed)
Covid, Flu A/B, and RSV all negative.    Jorge Regnier, NP  01/17/2021, 08:42

## 2021-01-25 ENCOUNTER — Other Ambulatory Visit: Payer: Self-pay | Admitting: Family Medicine

## 2021-01-26 ENCOUNTER — Encounter: Payer: Self-pay | Admitting: Family Medicine

## 2021-02-04 ENCOUNTER — Ambulatory Visit: Payer: 59 | Admitting: Family Medicine

## 2021-02-04 DIAGNOSIS — F419 Anxiety disorder, unspecified: Secondary | ICD-10-CM | POA: Insufficient documentation

## 2021-02-04 DIAGNOSIS — J45909 Unspecified asthma, uncomplicated: Secondary | ICD-10-CM | POA: Insufficient documentation

## 2021-02-09 ENCOUNTER — Ambulatory Visit: Payer: 59 | Admitting: Cardiology

## 2021-02-09 ENCOUNTER — Ambulatory Visit (INDEPENDENT_AMBULATORY_CARE_PROVIDER_SITE_OTHER): Payer: 59

## 2021-02-09 ENCOUNTER — Other Ambulatory Visit: Payer: Self-pay

## 2021-02-09 ENCOUNTER — Encounter: Payer: Self-pay | Admitting: Cardiology

## 2021-02-09 VITALS — BP 132/108 | HR 86 | Ht 66.0 in | Wt 224.0 lb

## 2021-02-09 DIAGNOSIS — R600 Localized edema: Secondary | ICD-10-CM

## 2021-02-09 DIAGNOSIS — I10 Essential (primary) hypertension: Secondary | ICD-10-CM | POA: Diagnosis not present

## 2021-02-09 DIAGNOSIS — E669 Obesity, unspecified: Secondary | ICD-10-CM

## 2021-02-09 DIAGNOSIS — R0602 Shortness of breath: Secondary | ICD-10-CM | POA: Diagnosis not present

## 2021-02-09 DIAGNOSIS — R002 Palpitations: Secondary | ICD-10-CM | POA: Diagnosis not present

## 2021-02-09 DIAGNOSIS — G4733 Obstructive sleep apnea (adult) (pediatric): Secondary | ICD-10-CM

## 2021-02-09 DIAGNOSIS — Z9989 Dependence on other enabling machines and devices: Secondary | ICD-10-CM

## 2021-02-09 NOTE — Progress Notes (Signed)
Cardiology Office Note:    Date:  02/09/2021   ID:  Peter Key, DOB January 26, 1991, MRN [id number]  PCP:  Nestor RampNeal, Sara L, MD  Cardiologist:  No primary care provider on file.  Electrophysiologist:  None   Referring MD: Nestor RampNeal, Sara L, MD   Chief Complaint  Patient presents with  . Chest Pain    History of Present Illness:    Peter AlstromHassan S Rhoda is a 30 y.o. male with a hx of hypertension recently started on candesartan unfortunately patient had some is a couple doses of this medication and did not take it this weekend, obstructive sleep apnea on CPAP anxiety, asthma is here today at the request of his PCP to be evaluated for palpitations as well as chest pain.  Patient last several weeks he has had intermittent palpitations.  He described as a fluttering feeling.  Associated with this is left-sided/midsternal chest pressure.  He notes that this is a pressure-like sensation that goes across his chest usually symptoms associated with shortness of breath.  The patient usually starts last for few minutes and then resolved.  Then chest pain occurs later.  Past Medical History:  Diagnosis Date  . Anxiety   . Asthma     Past Surgical History:  Procedure Laterality Date  . TONSILLECTOMY      Current Medications: Current Meds  Medication Sig  . candesartan (ATACAND) 16 MG tablet Take 1 tablet (16 mg total) by mouth at bedtime.  . pantoprazole (PROTONIX) 40 MG tablet Take 1 tablet (40 mg total) by mouth daily.     Allergies:   Patient has no known allergies.   Social History   Socioeconomic History  . Marital status: Single    Spouse name: Not on file  . Number of children: Not on file  . Years of education: Not on file  . Highest education level: Not on file  Occupational History  . Not on file  Tobacco Use  . Smoking status: Former Smoker    Packs/day: 0.25    Types: Cigarettes    Quit date: 10/18/2020    Years since quitting: 0.3  . Smokeless tobacco: Never Used   Vaping Use  . Vaping Use: Never used  Substance and Sexual Activity  . Alcohol use: No  . Drug use: No  . Sexual activity: Not on file  Other Topics Concern  . Not on file  Social History Narrative   ** Merged History Encounter **       Social Determinants of Health   Financial Resource Strain: Not on file  Food Insecurity: Not on file  Transportation Needs: Not on file  Physical Activity: Not on file  Stress: Not on file  Social Connections: Not on file     Family History: The patient's family history includes Diabetes in his maternal grandmother; Hypertension in his maternal grandfather and maternal grandmother.  ROS:   Review of Systems  Constitution: Negative for decreased appetite, fever and weight gain.  HENT: Negative for congestion, ear discharge, hoarse voice and sore throat.   Eyes: Negative for discharge, redness, vision loss in right eye and visual halos.  Cardiovascular: Negative for chest pain, dyspnea on exertion, leg swelling, orthopnea and palpitations.  Respiratory: Negative for cough, hemoptysis, shortness of breath and snoring.   Endocrine: Negative for heat intolerance and polyphagia.  Hematologic/Lymphatic: Negative for bleeding problem. Does not bruise/bleed easily.  Skin: Negative for flushing, nail changes, rash and suspicious lesions.  Musculoskeletal: Negative for arthritis, joint pain,  muscle cramps, myalgias, neck pain and stiffness.  Gastrointestinal: Negative for abdominal pain, bowel incontinence, diarrhea and excessive appetite.  Genitourinary: Negative for decreased libido, genital sores and incomplete emptying.  Neurological: Negative for brief paralysis, focal weakness, headaches and loss of balance.  Psychiatric/Behavioral: Negative for altered mental status, depression and suicidal ideas.  Allergic/Immunologic: Negative for HIV exposure and persistent infections.    EKGs/Labs/Other Studies Reviewed:    The following studies were  reviewed today:   EKG:  The ekg ordered today demonstrates sinus rhythm with heart rate 86 bpm.  Recent Labs: 01/07/2021: ALT 41; BUN 10; Creatinine, Ser 1.08; Hemoglobin 15.1; Platelets 283; Potassium 4.1; Sodium 140  Recent Lipid Panel    Component Value Date/Time   CHOL 159 01/07/2021 1038   TRIG 127 01/07/2021 1038   HDL 43 01/07/2021 1038   CHOLHDL 3.7 01/07/2021 1038   LDLCALC 93 01/07/2021 1038    Physical Exam:    VS:  BP (!) 132/108 (BP Location: Left Arm, Patient Position: Sitting, Cuff Size: Large)   Pulse 86   Ht 5\' 6"  (1.676 m)   Wt 224 lb (101.6 kg)   SpO2 99%   BMI 36.15 kg/m     Wt Readings from Last 3 Encounters:  02/09/21 224 lb (101.6 kg)  01/07/21 233 lb 9.6 oz (106 kg)  11/19/20 224 lb (101.6 kg)     GEN: Well nourished, well developed in no acute distress HEENT: Normal NECK: No JVD; No carotid bruits LYMPHATICS: No lymphadenopathy CARDIAC: S1S2 noted,RRR, no murmurs, rubs, gallops RESPIRATORY:  Clear to auscultation without rales, wheezing or rhonchi  ABDOMEN: Soft, non-tender, non-distended, +bowel sounds, no guarding. EXTREMITIES: No edema, No cyanosis, no clubbing MUSCULOSKELETAL:  No deformity  SKIN: Warm and dry NEUROLOGIC:  Alert and oriented x 3, non-focal PSYCHIATRIC:  Normal affect, good insight  ASSESSMENT:    1. Bilateral leg edema   2. Palpitations   3. SOB (shortness of breath)   4. Hypertension, unspecified type   5. OSA on CPAP   6. Obesity (BMI 30-39.9)    PLAN:     I would like to rule out a cardiovascular etiology of this palpitation, therefore at this time I would like to placed a zio patch for 14 days. In additon his shortness of breath, bilateral leg edema and atypical chest pain a transthoracic echocardiogram will be ordered to assess LV/RV function and any structural abnormalities. Once these testing have been performed amd reviewed further reccomendations will be made.   He is hypertensive in the office today.  I  did discuss with the patient that it is important that he take his blood pressure medicine.  Explained to the patient detrimental effects of not being compliant with his antihypertensive medication.  Expresses understanding and states that he is comfortable taking his medication as prescribed.  OSA-continue CPAP.  The patient understands the need to lose weight with diet and exercise. We have discussed specific strategies for this.  The patient is in agreement with the above plan. The patient left the office in stable condition.  The patient will follow up in 4 weeks due to blood pressure medication follow-up.   Medication Adjustments/Labs and Tests Ordered: Current medicines are reviewed at length with the patient today.  Concerns regarding medicines are outlined above.  Orders Placed This Encounter  Procedures  . LONG TERM MONITOR (3-14 DAYS)  . EKG 12-Lead  . ECHOCARDIOGRAM COMPLETE   No orders of the defined types were placed in this encounter.  Patient Instructions  Medication Instructions:  Your physician recommends that you continue on your current medications as directed. Please refer to the Current Medication list given to you today.  *If you need a refill on your cardiac medications before your next appointment, please call your pharmacy*   Lab Work: None If you have labs (blood work) drawn today and your tests are completely normal, you will receive your results only by: Marland Kitchen MyChart Message (if you have MyChart) OR . A paper copy in the mail If you have any lab test that is abnormal or we need to change your treatment, we will call you to review the results.   Testing/Procedures: Your physician has requested that you have an echocardiogram. Echocardiography is a painless test that uses sound waves to create images of your heart. It provides your doctor with information about the size and shape of your heart and how well your heart's chambers and valves are working. This  procedure takes approximately one hour. There are no restrictions for this procedure.  A zio monitor was ordered today. It will remain on for 14 days. You will then return monitor and event diary in provided box. It takes 1-2 weeks for report to be downloaded and returned to [place]. We will call you with the results. If monitor falls off or has orange flashing light, please call Zio for further instructions.    Follow-Up: At [facility], you and your health needs are our priority.  As part of our continuing mission to provide you with exceptional heart care, we have created designated Provider Care Teams.  These Care Teams include your primary Cardiologist (physician) and Advanced Practice Providers (APPs -  Physician Assistants and Nurse Practitioners) who all work together to provide you with the care you need, when you need it.  We recommend signing up for the patient portal called "MyChart".  Sign up information is provided on this After Visit Summary.  MyChart is used to connect with patients for Virtual Visits (Telemedicine).  Patients are able to view lab/test results, encounter notes, upcoming appointments, etc.  Non-urgent messages can be sent to your provider as well.   To learn more about what you can Peter Key with MyChart, go to [email or web address].    Your next appointment:   4 week(s)  The format for your next appointment:   In Person  Provider:   Thomasene Ripple, Peter Key   Other Instructions ZIO WHY IS MY DOCTOR PRESCRIBING ZIO? The Zio system is proven and trusted by physicians to detect and diagnose irregular heart rhythms -- and has been prescribed to hundreds of thousands of patients.  The FDA has cleared the Zio system to monitor for many different kinds of irregular heart rhythms. In a study, physicians were able to reach a diagnosis 90% of the time with the Zio system1.  You can wear the Zio monitor -- a small, discreet, comfortable patch -- during your normal day-to-day  activity, including while you sleep, shower, and exercise, while it records every single heartbeat for analysis.  1Barrett, P., et al. Comparison of 24 Hour Holter Monitoring Versus 14 Day Novel Adhesive Patch Electrocardiographic Monitoring. American Journal of Medicine, 2014.  ZIO VS. HOLTER MONITORING The Zio monitor can be comfortably worn for up to 14 days. Holter monitors can be worn for 24 to 48 hours, limiting the time to record any irregular heart rhythms you may have. Zio is able to capture data for the 51% of patients who have their first symptom-triggered arrhythmia  after 48 hours.1  LIVE WITHOUT RESTRICTIONS The Zio ambulatory cardiac monitor is a small, unobtrusive, and water-resistant patch--you might even forget you're wearing it. The Zio monitor records and stores every beat of your heart, whether you're sleeping, working out, or showering. REMOVE ON MAY 2ND, 2022.      Adopting a Healthy Lifestyle.  Know what a healthy weight is for you (roughly BMI <25) and aim to maintain this   Aim for 7+ servings of fruits and vegetables daily   65-80+ fluid ounces of water or unsweet tea for healthy kidneys   Limit to max 1 drink of alcohol per day; avoid smoking/tobacco   Limit animal fats in diet for cholesterol and heart health - choose grass fed whenever available   Avoid highly processed foods, and foods high in saturated/trans fats   Aim for low stress - take time to unwind and care for your mental health   Aim for 150 min of moderate intensity exercise weekly for heart health, and weights twice weekly for bone health   Aim for 7-9 hours of sleep daily   When it comes to diets, agreement about the perfect plan isnt easy to find, even among the experts. Experts at the [facility] of [employer] developed an idea known as the Healthy Eating Plate. Just imagine a plate divided into logical, healthy portions.   The emphasis is on diet quality:   Load up on  vegetables and fruits - one-half of your plate: Aim for color and variety, and remember that potatoes dont count.   Go for whole grains - one-quarter of your plate: Whole wheat, barley, wheat berries, quinoa, oats, brown rice, and foods made with them. If you want pasta, go with whole wheat pasta.   Protein power - one-quarter of your plate: Fish, chicken, beans, and nuts are all healthy, versatile protein sources. Limit red meat.   The diet, however, does go beyond the plate, offering a few other suggestions.   Use healthy plant oils, such as olive, canola, soy, corn, sunflower and peanut. Check the labels, and avoid partially hydrogenated oil, which have unhealthy trans fats.   If youre thirsty, drink water. Coffee and tea are good in moderation, but skip sugary drinks and limit milk and dairy products to one or two daily servings.   The type of carbohydrate in the diet is more important than the amount. Some sources of carbohydrates, such as vegetables, fruits, whole grains, and beans-are healthier than others.   Finally, stay active  Signed, Peter Ripple, Peter Key  02/09/2021 4:27 PM    [facility]

## 2021-02-09 NOTE — Patient Instructions (Signed)
Medication Instructions:  Your physician recommends that you continue on your current medications as directed. Please refer to the Current Medication list given to you today.  *If you need a refill on your cardiac medications before your next appointment, please call your pharmacy*   Lab Work: None If you have labs (blood work) drawn today and your tests are completely normal, you will receive your results only by: Marland Kitchen MyChart Message (if you have MyChart) OR . A paper copy in the mail If you have any lab test that is abnormal or we need to change your treatment, we will call you to review the results.   Testing/Procedures: Your physician has requested that you have an echocardiogram. Echocardiography is a painless test that uses sound waves to create images of your heart. It provides your doctor with information about the size and shape of your heart and how well your heart's chambers and valves are working. This procedure takes approximately one hour. There are no restrictions for this procedure.  A zio monitor was ordered today. It will remain on for 14 days. You will then return monitor and event diary in provided box. It takes 1-2 weeks for report to be downloaded and returned to [place]. We will call you with the results. If monitor falls off or has orange flashing light, please call Zio for further instructions.    Follow-Up: At [facility], you and your health needs are our priority.  As part of our continuing mission to provide you with exceptional heart care, we have created designated Provider Care Teams.  These Care Teams include your primary Cardiologist (physician) and Advanced Practice Providers (APPs -  Physician Assistants and Nurse Practitioners) who all work together to provide you with the care you need, when you need it.  We recommend signing up for the patient portal called "MyChart".  Sign up information is provided on this After Visit Summary.  MyChart is used to connect  with patients for Virtual Visits (Telemedicine).  Patients are able to view lab/test results, encounter notes, upcoming appointments, etc.  Non-urgent messages can be sent to your provider as well.   To learn more about what you can do with MyChart, go to [email or web address].    Your next appointment:   4 week(s)  The format for your next appointment:   In Person  Provider:   Thomasene Ripple, DO   Other Instructions ZIO WHY IS MY DOCTOR PRESCRIBING ZIO? The Zio system is proven and trusted by physicians to detect and diagnose irregular heart rhythms -- and has been prescribed to hundreds of thousands of patients.  The FDA has cleared the Zio system to monitor for many different kinds of irregular heart rhythms. In a study, physicians were able to reach a diagnosis 90% of the time with the Zio system1.  You can wear the Zio monitor -- a small, discreet, comfortable patch -- during your normal day-to-day activity, including while you sleep, shower, and exercise, while it records every single heartbeat for analysis.  1Barrett, P., et al. Comparison of 24 Hour Holter Monitoring Versus 14 Day Novel Adhesive Patch Electrocardiographic Monitoring. American Journal of Medicine, 2014.  ZIO VS. HOLTER MONITORING The Zio monitor can be comfortably worn for up to 14 days. Holter monitors can be worn for 24 to 48 hours, limiting the time to record any irregular heart rhythms you may have. Zio is able to capture data for the 51% of patients who have their first symptom-triggered arrhythmia after 48 hours.1  LIVE WITHOUT RESTRICTIONS The Zio ambulatory cardiac monitor is a small, unobtrusive, and water-resistant patch--you might even forget you're wearing it. The Zio monitor records and stores every beat of your heart, whether you're sleeping, working out, or showering. REMOVE ON MAY 2ND, 2022.

## 2021-02-10 ENCOUNTER — Ambulatory Visit: Payer: 59 | Admitting: Family Medicine

## 2021-02-10 NOTE — Progress Notes (Deleted)
    SUBJECTIVE:   CHIEF COMPLAINT / HPI:   Chest discomfort: Heart referral?   HTN: started on candesartan    PERTINENT  PMH / PSH: ***  OBJECTIVE:   There were no vitals taken for this visit.  ***  ASSESSMENT/PLAN:   No problem-specific Assessment & Plan notes found for this encounter.     Sandre Kitty, MD [facility]   {    This will disappear when note is signed, click to select method of visit    :1}

## 2021-03-06 ENCOUNTER — Other Ambulatory Visit (HOSPITAL_COMMUNITY): Payer: 59

## 2021-03-09 ENCOUNTER — Encounter (HOSPITAL_COMMUNITY): Payer: Self-pay | Admitting: Neonatology

## 2021-03-09 ENCOUNTER — Other Ambulatory Visit: Payer: Self-pay

## 2021-03-09 ENCOUNTER — Ambulatory Visit (HOSPITAL_COMMUNITY)
Admission: RE | Admit: 2021-03-09 | Discharge: 2021-03-09 | Disposition: A | Discharge status: Home / Self Care | Payer: 59 | Source: Ambulatory Visit | Attending: Cardiology | Admitting: Cardiology

## 2021-03-09 DIAGNOSIS — R0602 Shortness of breath: Secondary | ICD-10-CM | POA: Diagnosis not present

## 2021-03-09 DIAGNOSIS — R0600 Dyspnea, unspecified: Secondary | ICD-10-CM | POA: Insufficient documentation

## 2021-03-09 DIAGNOSIS — R600 Localized edema: Secondary | ICD-10-CM

## 2021-03-09 LAB — ECHOCARDIOGRAM COMPLETE
Area-P 1/2: 3.93 cm2
S' Lateral: 3.3 cm

## 2021-03-09 NOTE — Progress Notes (Signed)
  Echocardiogram 2D Echocardiogram has been performed.  Peter Key 03/09/2021, 8:50 AM

## 2021-03-12 ENCOUNTER — Encounter: Payer: Self-pay | Admitting: Cardiology

## 2021-03-12 ENCOUNTER — Other Ambulatory Visit: Payer: Self-pay

## 2021-03-12 ENCOUNTER — Ambulatory Visit: Payer: 59 | Admitting: Cardiology

## 2021-03-12 VITALS — BP 128/84 | HR 73 | Ht 66.0 in | Wt 229.0 lb

## 2021-03-12 DIAGNOSIS — Z9989 Dependence on other enabling machines and devices: Secondary | ICD-10-CM

## 2021-03-12 DIAGNOSIS — I10 Essential (primary) hypertension: Secondary | ICD-10-CM

## 2021-03-12 DIAGNOSIS — G4733 Obstructive sleep apnea (adult) (pediatric): Secondary | ICD-10-CM

## 2021-03-12 DIAGNOSIS — E669 Obesity, unspecified: Secondary | ICD-10-CM | POA: Diagnosis not present

## 2021-03-12 NOTE — Patient Instructions (Signed)
Medication Instructions:  Your physician recommends that you continue on your current medications as directed. Please refer to the Current Medication list given to you today.  *If you need a refill on your cardiac medications before your next appointment, please call your pharmacy*   Lab Work: None If you have labs (blood work) drawn today and your tests are completely normal, you will receive your results only by: Marland Kitchen MyChart Message (if you have MyChart) OR . A paper copy in the mail If you have any lab test that is abnormal or we need to change your treatment, we will call you to review the results.   Testing/Procedures: None   Follow-Up: At [facility], you and your health needs are our priority.  As part of our continuing mission to provide you with exceptional heart care, we have created designated Provider Care Teams.  These Care Teams include your primary Cardiologist (physician) and Advanced Practice Providers (APPs -  Physician Assistants and Nurse Practitioners) who all work together to provide you with the care you need, when you need it.  We recommend signing up for the patient portal called "MyChart".  Sign up information is provided on this After Visit Summary.  MyChart is used to connect with patients for Virtual Visits (Telemedicine).  Patients are able to view lab/test results, encounter notes, upcoming appointments, etc.  Non-urgent messages can be sent to your provider as well.   To learn more about what you can do with MyChart, go to [email or web address].    Your next appointment:   1 year(s)  The format for your next appointment:   In Person  Provider:   Thomasene Ripple, DO   Other Instructions [email or web address]        FDA-cleared, clinical grade mobile EKG monitor: Lourena Simmonds is the most clinically-validated mobile EKG used by the world's leading cardiac care medical professionals With Basic service, know  instantly if your heart rhythm is normal or if atrial fibrillation is detected, and email the last single EKG recording to yourself or your doctor Premium service, available for purchase through the Kardia app for $9.99 per month or $99 per year, includes unlimited history and storage of your EKG recordings, a monthly EKG summary report to share with your doctor, along with the ability to track your blood pressure, activity and weight Includes one KardiaMobile phone clip FREE SHIPPING: Standard delivery 1-3 business days. Orders placed by 11:00am PST will ship that afternoon. Otherwise, will ship next business day. All orders ship via [employer] from [place], [place]      Mediterranean Diet A Mediterranean diet refers to food and lifestyle choices that are based on the traditions of countries located on the [employer] [place]. This way of eating has been shown to help prevent certain conditions and improve outcomes for people who have chronic diseases, like kidney disease and heart disease. What are tips for following this plan? Lifestyle  Cook and eat meals together with your family, when possible.  Drink enough fluid to keep your urine clear or pale yellow.  Be physically active every day. This includes: ? Aerobic exercise like running or swimming. ? Leisure activities like gardening, walking, or housework.  Get 7-8 hours of sleep each night.  If recommended by your health care provider, drink red wine in moderation. This means 1 glass a day for nonpregnant women and 2 glasses a day for men. A glass of wine equals 5 oz (150 mL). Reading food labels  Check the serving size  of packaged foods. For foods such as rice and pasta, the serving size refers to the amount of cooked product, not dry.  Check the total fat in packaged foods. Avoid foods that have saturated fat or trans fats.  Check the ingredients list for added sugars, such as corn syrup.   Shopping  At the grocery store, buy  most of your food from the areas near the walls of the store. This includes: ? Fresh fruits and vegetables (produce). ? Grains, beans, nuts, and seeds. Some of these may be available in unpackaged forms or large amounts (in bulk). ? Fresh seafood. ? Poultry and eggs. ? Low-fat dairy products.  Buy whole ingredients instead of prepackaged foods.  Buy fresh fruits and vegetables in-season from local farmers markets.  Buy frozen fruits and vegetables in resealable bags.  If you do not have access to quality fresh seafood, buy precooked frozen shrimp or canned fish, such as tuna, salmon, or sardines.  Buy small amounts of raw or cooked vegetables, salads, or olives from the deli or salad bar at your store.  Stock your pantry so you always have certain foods on hand, such as olive oil, canned tuna, canned tomatoes, rice, pasta, and beans. Cooking  Cook foods with extra-virgin olive oil instead of using butter or other vegetable oils.  Have meat as a side dish, and have vegetables or grains as your main dish. This means having meat in small portions or adding small amounts of meat to foods like pasta or stew.  Use beans or vegetables instead of meat in common dishes like chili or lasagna.  Experiment with different cooking methods. Try roasting or broiling vegetables instead of steaming or sauteing them.  Add frozen vegetables to soups, stews, pasta, or rice.  Add nuts or seeds for added healthy fat at each meal. You can add these to yogurt, salads, or vegetable dishes.  Marinate fish or vegetables using olive oil, lemon juice, garlic, and fresh herbs. Meal planning  Plan to eat 1 vegetarian meal one day each week. Try to work up to 2 vegetarian meals, if possible.  Eat seafood 2 or more times a week.  Have healthy snacks readily available, such as: ? Vegetable sticks with hummus. ? [place] yogurt. ? Fruit and nut trail mix.  Eat balanced meals throughout the week. This  includes: ? Fruit: 2-3 servings a day ? Vegetables: 4-5 servings a day ? Low-fat dairy: 2 servings a day ? Fish, poultry, or lean meat: 1 serving a day ? Beans and legumes: 2 or more servings a week ? Nuts and seeds: 1-2 servings a day ? Whole grains: 6-8 servings a day ? Extra-virgin olive oil: 3-4 servings a day  Limit red meat and sweets to only a few servings a month   What are my food choices?  Mediterranean diet ? Recommended  Grains: Whole-grain pasta. Brown rice. Bulgar wheat. Polenta. Couscous. Whole-wheat bread. Orpah Cobb.  Vegetables: Artichokes. Beets. Broccoli. Cabbage. Carrots. Eggplant. Green beans. Chard. Kale. Spinach. Onions. Leeks. Peas. Squash. Tomatoes. Peppers. Radishes.  Fruits: Apples. Apricots. Avocado. Berries. Bananas. Cherries. Dates. Figs. Grapes. Lemons. Melon. Oranges. Peaches. Plums. Pomegranate.  Meats and other protein foods: Beans. Almonds. Sunflower seeds. Pine nuts. Peanuts. Cod. Salmon. Scallops. Shrimp. Tuna. Tilapia. Clams. Oysters. Eggs.  Dairy: Low-fat milk. Cheese. Greek yogurt.  Beverages: Water. Red wine. Herbal tea.  Fats and oils: Extra virgin olive oil. Avocado oil. Grape seed oil.  Sweets and desserts: [place] yogurt with honey. Baked apples.  Poached pears. Trail mix.  Seasoning and other foods: Basil. Cilantro. Coriander. Cumin. Mint. Parsley. Sage. Rosemary. Tarragon. Garlic. Oregano. Thyme. Pepper. Balsalmic vinegar. Tahini. Hummus. Tomato sauce. Olives. Mushrooms. ? Limit these  Grains: Prepackaged pasta or rice dishes. Prepackaged cereal with added sugar.  Vegetables: Deep fried potatoes (french fries).  Fruits: Fruit canned in syrup.  Meats and other protein foods: Beef. Pork. Lamb. Poultry with skin. Hot dogs. Tomasa Blase.  Dairy: Ice cream. Sour cream. Whole milk.  Beverages: Juice. Sugar-sweetened soft drinks. Beer. Liquor and spirits.  Fats and oils: Butter. Canola oil. Vegetable oil. Beef fat (tallow).  Lard.  Sweets and desserts: Cookies. Cakes. Pies. Candy.  Seasoning and other foods: Mayonnaise. Premade sauces and marinades. The items listed may not be a complete list. Talk with your dietitian about what dietary choices are right for you. Summary  The Mediterranean diet includes both food and lifestyle choices.  Eat a variety of fresh fruits and vegetables, beans, nuts, seeds, and whole grains.  Limit the amount of red meat and sweets that you eat.  Talk with your health care provider about whether it is safe for you to drink red wine in moderation. This means 1 glass a day for nonpregnant women and 2 glasses a day for men. A glass of wine equals 5 oz (150 mL). This information is not intended to replace advice given to you by your health care provider. Make sure you discuss any questions you have with your health care provider. Document Revised: 06/10/2016 Document Reviewed: 06/03/2016 Elsevier Patient Education  2020 [employer].

## 2021-03-12 NOTE — Progress Notes (Signed)
Cardiology Office Note:    Date:  03/12/2021   ID:  Peter Key, DOB 03-Mar-1991, MRN [id number]  PCP:  Nestor Ramp, MD  Cardiologist:  Thomasene Ripple, DO  Electrophysiologist:  None   Referring MD: Nestor Ramp, MD   I am doing a lot better  History of Present Illness:    Peter Key is a 30 y.o. male with a hx of hypertension, OSA on CPAP, anxiety, asthma is here today for follow-up visit.  I saw the patient on February 09, 2021 at that time he had been experiencing some palpitations as well as shortness of breath.  He was also hypertensive he has just recently been started on candesartan therefore I did not change his medication.  Interim he was able to get his ZIO monitor as well as his echocardiogram.  These results has previously been called out to the patient he is here today for follow-up visit.  Tells me that he is adopting a more healthy lifestyle.  He started eating healthy he did cut back on drinking alcohol and he can increase his exercise.   Past Medical History:  Diagnosis Date  . Anxiety   . Asthma     Past Surgical History:  Procedure Laterality Date  . TONSILLECTOMY      Current Medications: Current Meds  Medication Sig  . candesartan (ATACAND) 16 MG tablet Take 1 tablet (16 mg total) by mouth at bedtime.  . pantoprazole (PROTONIX) 40 MG tablet Take 1 tablet (40 mg total) by mouth daily.     Allergies:   Patient has no known allergies.   Social History   Socioeconomic History  . Marital status: Single    Spouse name: Not on file  . Number of children: Not on file  . Years of education: Not on file  . Highest education level: Not on file  Occupational History  . Not on file  Tobacco Use  . Smoking status: Former Smoker    Packs/day: 0.25    Types: Cigarettes    Quit date: 10/18/2020    Years since quitting: 0.3  . Smokeless tobacco: Never Used  Vaping Use  . Vaping Use: Never used  Substance and Sexual Activity  . Alcohol use: No   . Drug use: No  . Sexual activity: Not on file  Other Topics Concern  . Not on file  Social History Narrative   ** Merged History Encounter **       Social Determinants of Health   Financial Resource Strain: Not on file  Food Insecurity: Not on file  Transportation Needs: Not on file  Physical Activity: Not on file  Stress: Not on file  Social Connections: Not on file     Family History: The patient's family history includes Diabetes in his maternal grandmother; Hypertension in his maternal grandfather and maternal grandmother.  ROS:   Review of Systems  Constitution: Negative for decreased appetite, fever and weight gain.  HENT: Negative for congestion, ear discharge, hoarse voice and sore throat.   Eyes: Negative for discharge, redness, vision loss in right eye and visual halos.  Cardiovascular: Negative for chest pain, dyspnea on exertion, leg swelling, orthopnea and palpitations.  Respiratory: Negative for cough, hemoptysis, shortness of breath and snoring.   Endocrine: Negative for heat intolerance and polyphagia.  Hematologic/Lymphatic: Negative for bleeding problem. Does not bruise/bleed easily.  Skin: Negative for flushing, nail changes, rash and suspicious lesions.  Musculoskeletal: Negative for arthritis, joint pain, muscle cramps, myalgias,  neck pain and stiffness.  Gastrointestinal: Negative for abdominal pain, bowel incontinence, diarrhea and excessive appetite.  Genitourinary: Negative for decreased libido, genital sores and incomplete emptying.  Neurological: Negative for brief paralysis, focal weakness, headaches and loss of balance.  Psychiatric/Behavioral: Negative for altered mental status, depression and suicidal ideas.  Allergic/Immunologic: Negative for HIV exposure and persistent infections.    EKGs/Labs/Other Studies Reviewed:    The following studies were reviewed today:   EKG: None today  TTE 03/09/2021 IMPRESSIONS    1. Left ventricular  ejection fraction, by estimation, is 55 to 60%. The  left ventricle has normal function. The left ventricle has no regional  wall motion abnormalities. Left ventricular diastolic parameters were  normal.  2. Right ventricular systolic function is normal. The right ventricular  size is normal. There is normal pulmonary artery systolic pressure.  3. The mitral valve is normal in structure. Trivial mitral valve  regurgitation. No evidence of mitral stenosis.  4. The aortic valve is tricuspid. Aortic valve regurgitation is not  visualized. No aortic stenosis is present.  5. The inferior vena cava is normal in size with greater than 50%  respiratory variability, suggesting right atrial pressure of 3 mmHg.   FINDINGS  Left Ventricle: Left ventricular ejection fraction, by estimation, is 55  to 60%. The left ventricle has normal function. The left ventricle has no  regional wall motion abnormalities. Global longitudinal strain performed  but not reported based on  interpreter judgement due to suboptimal tracking. 3D left ventricular  ejection fraction analysis performed but not reported based on interpreter  judgement due to suboptimal quality. The left ventricular internal cavity  size was normal in size. There is  no left ventricular hypertrophy. Left ventricular diastolic parameters  were normal. Normal left ventricular filling pressure.   Right Ventricle: The right ventricular size is normal. No increase in  right ventricular wall thickness. Right ventricular systolic function is  normal. There is normal pulmonary artery systolic pressure. The tricuspid  regurgitant velocity is 1.55 m/s, and  with an assumed right atrial pressure of 3 mmHg, the estimated right  ventricular systolic pressure is 12.6 mmHg.   Left Atrium: Left atrial size was normal in size.   Right Atrium: Right atrial size was normal in size.   Pericardium: There is no evidence of pericardial effusion.    Mitral Valve: The mitral valve is normal in structure. Trivial mitral  valve regurgitation. No evidence of mitral valve stenosis.   Tricuspid Valve: The tricuspid valve is normal in structure. Tricuspid  valve regurgitation is trivial.   Aortic Valve: The aortic valve is tricuspid. Aortic valve regurgitation is  not visualized. No aortic stenosis is present.   Pulmonic Valve: The pulmonic valve was normal in structure. Pulmonic valve  regurgitation is not visualized.   Aorta: The aortic root and ascending aorta are structurally normal, with  no evidence of dilitation.   Venous: The inferior vena cava is normal in size with greater than 50%  respiratory variability, suggesting right atrial pressure of 3 mmHg.   IAS/Shunts: No atrial level shunt detected by color flow Doppler.      Zio monitor  The patient wore the monitor for 14 days starting February 09, 2021 Indication: Palpitations  The minimum heart rate was 50 bpm, maximum heart rate was 187 bpm, and average heart rate was 88 bpm. Predominant underlying rhythm was Sinus Rhythm.   Premature atrial complexes were rare less than 1%. Premature Ventricular complexes were rare less than  1%.  No ventricular tachycardia, no pauses, No AV block and no atrial fibrillation present. Symptoms associated with sinus rhythm.  Conclusion: Normal/unremarkable study with no evidence of significant arrhythmia.    Recent Labs: 01/07/2021: ALT 41; BUN 10; Creatinine, Ser 1.08; Hemoglobin 15.1; Platelets 283; Potassium 4.1; Sodium 140  Recent Lipid Panel    Component Value Date/Time   CHOL 159 01/07/2021 1038   TRIG 127 01/07/2021 1038   HDL 43 01/07/2021 1038   CHOLHDL 3.7 01/07/2021 1038   LDLCALC 93 01/07/2021 1038    Physical Exam:    VS:  BP 128/84   Pulse 73   Ht 5\' 6"  (1.676 m)   Wt 229 lb (103.9 kg)   SpO2 96%   BMI 36.96 kg/m     Wt Readings from Last 3 Encounters:  03/12/21 229 lb (103.9 kg)  02/09/21 224  lb (101.6 kg)  01/07/21 233 lb 9.6 oz (106 kg)     GEN: Well nourished, well developed in no acute distress HEENT: Normal NECK: No JVD; No carotid bruits LYMPHATICS: No lymphadenopathy CARDIAC: S1S2 noted,RRR, no murmurs, rubs, gallops RESPIRATORY:  Clear to auscultation without rales, wheezing or rhonchi  ABDOMEN: Soft, non-tender, non-distended, +bowel sounds, no guarding. EXTREMITIES: No edema, No cyanosis, no clubbing MUSCULOSKELETAL:  No deformity  SKIN: Warm and dry NEUROLOGIC:  Alert and oriented x 3, non-focal PSYCHIATRIC:  Normal affect, good insight  ASSESSMENT:    1. Hypertension, unspecified type   2. Obesity (BMI 30-39.9)   3. OSA on CPAP    PLAN:    His blood pressure acceptable in the office today.  We will keep him on his current medication dose.  I discussed with the patient about his testing result his ZIO monitor which was normal as well as his echocardiogram.  The patient understands the need to lose weight with diet and exercise. We have discussed specific strategies for this.  The patient is in agreement with the above plan. The patient left the office in stable condition.  The patient will follow up in person in clinic.   Medication Adjustments/Labs and Tests Ordered: Current medicines are reviewed at length with the patient today.  Concerns regarding medicines are outlined above.  No orders of the defined types were placed in this encounter.  No orders of the defined types were placed in this encounter.   Patient Instructions   Medication Instructions:  Your physician recommends that you continue on your current medications as directed. Please refer to the Current Medication list given to you today.  *If you need a refill on your cardiac medications before your next appointment, please call your pharmacy*   Lab Work: None If you have labs (blood work) drawn today and your tests are completely normal, you will receive your results only  by: 01/09/21 MyChart Message (if you have MyChart) OR . A paper copy in the mail If you have any lab test that is abnormal or we need to change your treatment, we will call you to review the results.   Testing/Procedures: None   Follow-Up: At [facility], you and your health needs are our priority.  As part of our continuing mission to provide you with exceptional heart care, we have created designated Provider Care Teams.  These Care Teams include your primary Cardiologist (physician) and Advanced Practice Providers (APPs -  Physician Assistants and Nurse Practitioners) who all work together to provide you with the care you need, when you need it.  We recommend signing  up for the patient portal called "MyChart".  Sign up information is provided on this After Visit Summary.  MyChart is used to connect with patients for Virtual Visits (Telemedicine).  Patients are able to view lab/test results, encounter notes, upcoming appointments, etc.  Non-urgent messages can be sent to your provider as well.   To learn more about what you can do with MyChart, go to [email or web address].    Your next appointment:   1 year(s)  The format for your next appointment:   In Person  Provider:   Thomasene RippleKardie Remer Couse, DO   Other Instructions [email or web address]        FDA-cleared, clinical grade mobile EKG monitor: Lourena SimmondsKardia is the most clinically-validated mobile EKG used by the world's leading cardiac care medical professionals With Basic service, know instantly if your heart rhythm is normal or if atrial fibrillation is detected, and email the last single EKG recording to yourself or your doctor Premium service, available for purchase through the Kardia app for $9.99 per month or $99 per year, includes unlimited history and storage of your EKG recordings, a monthly EKG summary report to share with your doctor, along with the ability to track your blood pressure, activity  and weight Includes one KardiaMobile phone clip FREE SHIPPING: Standard delivery 1-3 business days. Orders placed by 11:00am PST will ship that afternoon. Otherwise, will ship next business day. All orders ship via [employer] Priority Mail from [place], [place]      Mediterranean Diet A Mediterranean diet refers to food and lifestyle choices that are based on the traditions of countries located on the [place]. This way of eating has been shown to help prevent certain conditions and improve outcomes for people who have chronic diseases, like kidney disease and heart disease. What are tips for following this plan? Lifestyle  Cook and eat meals together with your family, when possible.  Drink enough fluid to keep your urine clear or pale yellow.  Be physically active every day. This includes: ? Aerobic exercise like running or swimming. ? Leisure activities like gardening, walking, or housework.  Get 7-8 hours of sleep each night.  If recommended by your health care provider, drink red wine in moderation. This means 1 glass a day for nonpregnant women and 2 glasses a day for men. A glass of wine equals 5 oz (150 mL). Reading food labels  Check the serving size of packaged foods. For foods such as rice and pasta, the serving size refers to the amount of cooked product, not dry.  Check the total fat in packaged foods. Avoid foods that have saturated fat or trans fats.  Check the ingredients list for added sugars, such as corn syrup.   Shopping  At the grocery store, buy most of your food from the areas near the walls of the store. This includes: ? Fresh fruits and vegetables (produce). ? Grains, beans, nuts, and seeds. Some of these may be available in unpackaged forms or large amounts (in bulk). ? Fresh seafood. ? Poultry and eggs. ? Low-fat dairy products.  Buy whole ingredients instead of prepackaged foods.  Buy fresh fruits and vegetables in-season from local farmers  markets.  Buy frozen fruits and vegetables in resealable bags.  If you do not have access to quality fresh seafood, buy precooked frozen shrimp or canned fish, such as tuna, salmon, or sardines.  Buy small amounts of raw or cooked vegetables, salads, or olives from the deli or salad bar at your store.  Stock your  pantry so you always have certain foods on hand, such as olive oil, canned tuna, canned tomatoes, rice, pasta, and beans. Cooking  Cook foods with extra-virgin olive oil instead of using butter or other vegetable oils.  Have meat as a side dish, and have vegetables or grains as your main dish. This means having meat in small portions or adding small amounts of meat to foods like pasta or stew.  Use beans or vegetables instead of meat in common dishes like chili or lasagna.  Experiment with different cooking methods. Try roasting or broiling vegetables instead of steaming or sauteing them.  Add frozen vegetables to soups, stews, pasta, or rice.  Add nuts or seeds for added healthy fat at each meal. You can add these to yogurt, salads, or vegetable dishes.  Marinate fish or vegetables using olive oil, lemon juice, garlic, and fresh herbs. Meal planning  Plan to eat 1 vegetarian meal one day each week. Try to work up to 2 vegetarian meals, if possible.  Eat seafood 2 or more times a week.  Have healthy snacks readily available, such as: ? Vegetable sticks with hummus. ? [place] yogurt. ? Fruit and nut trail mix.  Eat balanced meals throughout the week. This includes: ? Fruit: 2-3 servings a day ? Vegetables: 4-5 servings a day ? Low-fat dairy: 2 servings a day ? Fish, poultry, or lean meat: 1 serving a day ? Beans and legumes: 2 or more servings a week ? Nuts and seeds: 1-2 servings a day ? Whole grains: 6-8 servings a day ? Extra-virgin olive oil: 3-4 servings a day  Limit red meat and sweets to only a few servings a month   What are my food  choices?  Mediterranean diet ? Recommended  Grains: Whole-grain pasta. Brown rice. Bulgar wheat. Polenta. Couscous. Whole-wheat bread. Orpah Cobb.  Vegetables: Artichokes. Beets. Broccoli. Cabbage. Carrots. Eggplant. Green beans. Chard. Kale. Spinach. Onions. Leeks. Peas. Squash. Tomatoes. Peppers. Radishes.  Fruits: Apples. Apricots. Avocado. Berries. Bananas. Cherries. Dates. Figs. Grapes. Lemons. Melon. Oranges. Peaches. Plums. Pomegranate.  Meats and other protein foods: Beans. Almonds. Sunflower seeds. Pine nuts. Peanuts. Cod. Salmon. Scallops. Shrimp. Tuna. Tilapia. Clams. Oysters. Eggs.  Dairy: Low-fat milk. Cheese. Greek yogurt.  Beverages: Water. Red wine. Herbal tea.  Fats and oils: Extra virgin olive oil. Avocado oil. Grape seed oil.  Sweets and desserts: [place] yogurt with honey. Baked apples. Poached pears. Trail mix.  Seasoning and other foods: Basil. Cilantro. Coriander. Cumin. Mint. Parsley. Sage. Rosemary. Tarragon. Garlic. Oregano. Thyme. Pepper. Balsalmic vinegar. Tahini. Hummus. Tomato sauce. Olives. Mushrooms. ? Limit these  Grains: Prepackaged pasta or rice dishes. Prepackaged cereal with added sugar.  Vegetables: Deep fried potatoes (french fries).  Fruits: Fruit canned in syrup.  Meats and other protein foods: Beef. Pork. Lamb. Poultry with skin. Hot dogs. Tomasa Blase.  Dairy: Ice cream. Sour cream. Whole milk.  Beverages: Juice. Sugar-sweetened soft drinks. Beer. Liquor and spirits.  Fats and oils: Butter. Canola oil. Vegetable oil. Beef fat (tallow). Lard.  Sweets and desserts: Cookies. Cakes. Pies. Candy.  Seasoning and other foods: Mayonnaise. Premade sauces and marinades. The items listed may not be a complete list. Talk with your dietitian about what dietary choices are right for you. Summary  The Mediterranean diet includes both food and lifestyle choices.  Eat a variety of fresh fruits and vegetables, beans, nuts, seeds, and whole  grains.  Limit the amount of red meat and sweets that you eat.  Talk with your health care provider  about whether it is safe for you to drink red wine in moderation. This means 1 glass a day for nonpregnant women and 2 glasses a day for men. A glass of wine equals 5 oz (150 mL). This information is not intended to replace advice given to you by your health care provider. Make sure you discuss any questions you have with your health care provider. Document Revised: 06/10/2016 Document Reviewed: 06/03/2016 Elsevier Patient Education  2020 [employer].      Adopting a Healthy Lifestyle.  Know what a healthy weight is for you (roughly BMI <25) and aim to maintain this   Aim for 7+ servings of fruits and vegetables daily   65-80+ fluid ounces of water or unsweet tea for healthy kidneys   Limit to max 1 drink of alcohol per day; avoid smoking/tobacco   Limit animal fats in diet for cholesterol and heart health - choose grass fed whenever available   Avoid highly processed foods, and foods high in saturated/trans fats   Aim for low stress - take time to unwind and care for your mental health   Aim for 150 min of moderate intensity exercise weekly for heart health, and weights twice weekly for bone health   Aim for 7-9 hours of sleep daily   When it comes to diets, agreement about the perfect plan isnt easy to find, even among the experts. Experts at the [facility] [employer] developed an idea known as the Healthy Eating Plate. Just imagine a plate divided into logical, healthy portions.   The emphasis is on diet quality:   Load up on vegetables and fruits - one-half of your plate: Aim for color and variety, and remember that potatoes dont count.   Go for whole grains - one-quarter of your plate: Whole wheat, barley, wheat berries, quinoa, oats, brown rice, and foods made with them. If you want pasta, go with whole wheat pasta.   Protein power - one-quarter of your  plate: Fish, chicken, beans, and nuts are all healthy, versatile protein sources. Limit red meat.   The diet, however, does go beyond the plate, offering a few other suggestions.   Use healthy plant oils, such as olive, canola, soy, corn, sunflower and peanut. Check the labels, and avoid partially hydrogenated oil, which have unhealthy trans fats.   If youre thirsty, drink water. Coffee and tea are good in moderation, but skip sugary drinks and limit milk and dairy products to one or two daily servings.   The type of carbohydrate in the diet is more important than the amount. Some sources of carbohydrates, such as vegetables, fruits, whole grains, and beans-are healthier than others.   Finally, stay active  Signed, Thomasene Ripple, DO  03/12/2021 8:55 AM    [facility]

## 2021-04-01 ENCOUNTER — Other Ambulatory Visit (HOSPITAL_COMMUNITY): Payer: 59

## 2021-04-01 ENCOUNTER — Telehealth: Payer: Self-pay

## 2021-04-01 NOTE — Telephone Encounter (Signed)
-----   Message from Thomasene Ripple, DO sent at 03/09/2021 10:19 AM EDT ----- Echo normal

## 2021-04-01 NOTE — Telephone Encounter (Signed)
Left message on patients voicemail to please return our call.   

## 2021-04-15 ENCOUNTER — Ambulatory Visit: Payer: 59 | Admitting: Family Medicine

## 2021-06-08 ENCOUNTER — Other Ambulatory Visit: Payer: Self-pay | Admitting: *Deleted

## 2021-06-08 MED ORDER — PANTOPRAZOLE SODIUM 40 MG PO TBEC
40.0000 mg | DELAYED_RELEASE_TABLET | Freq: Every day | ORAL | 3 refills | Status: DC
Start: 2021-06-08 — End: 2022-03-05

## 2021-07-14 IMAGING — CR DG CHEST 2V
2 series · 2 of 2 positions shown · non-contrast
Comparison: 12/25/2018

CLINICAL DATA: Headache, dizziness for 2 days

EXAM:
CHEST - 2 VIEW

[w chest pa]
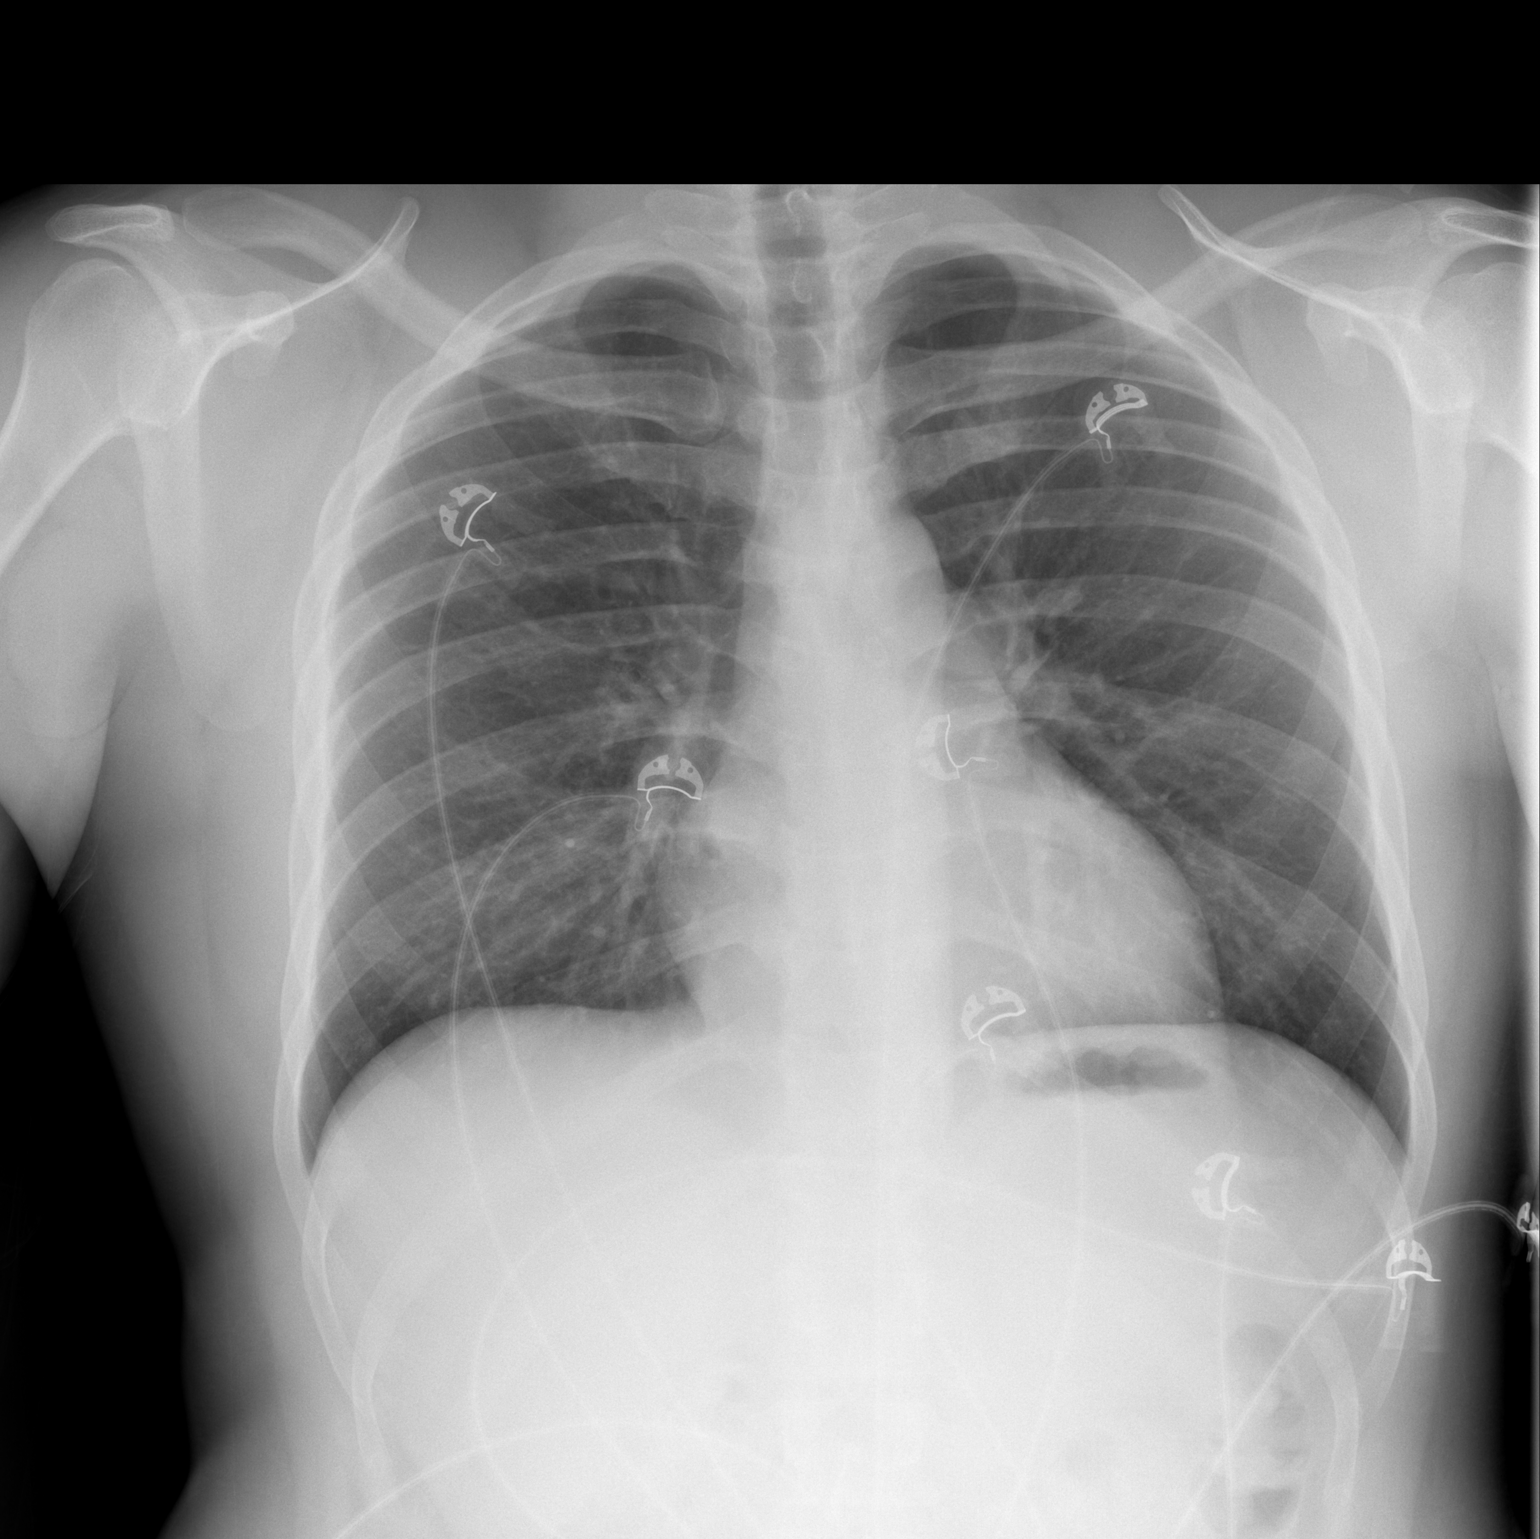

[w chest lat]
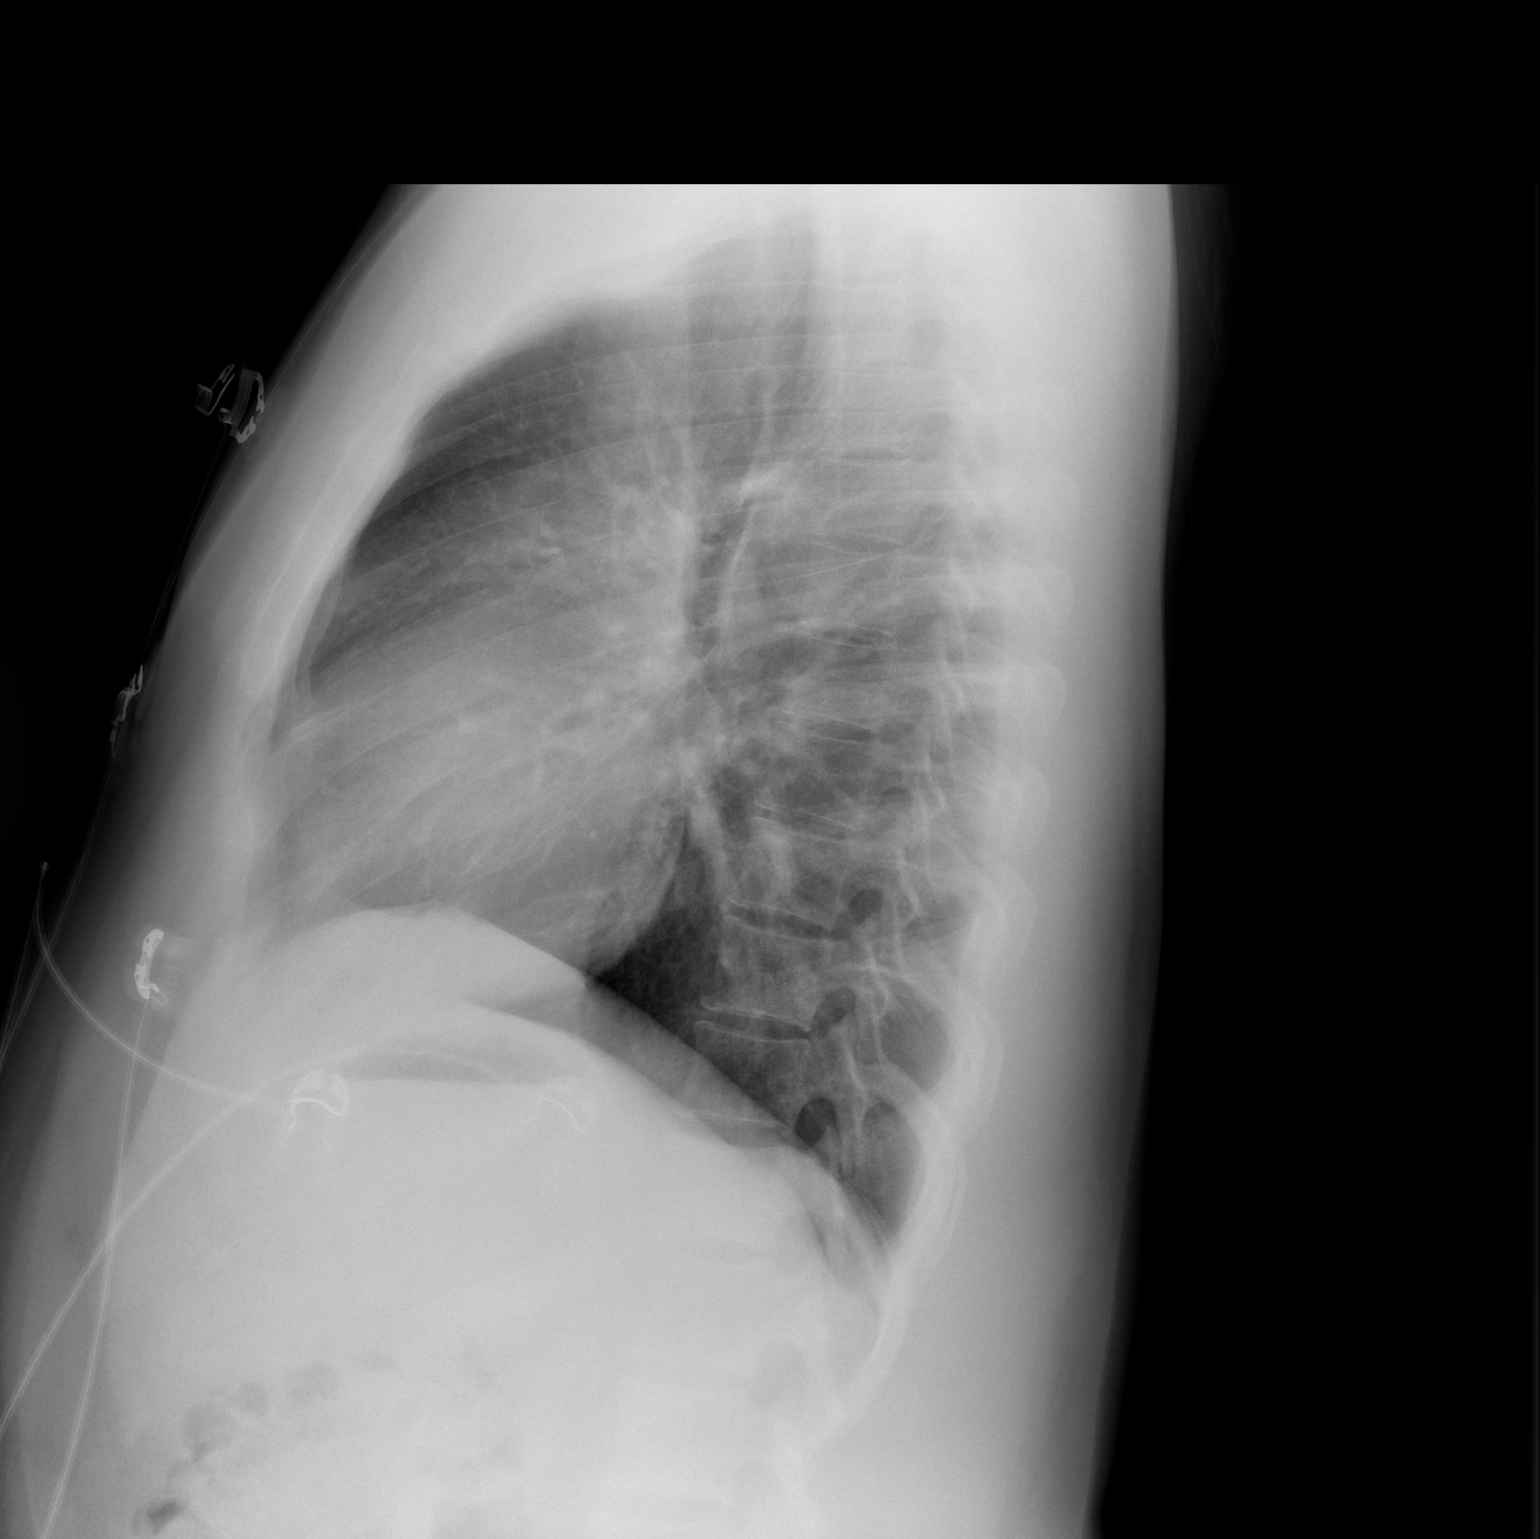

[2 of 2 positions shown; findings below may reference images not displayed]

FINDINGS: The heart size and mediastinal contours are within normal limits.
Both lungs are clear. The visualized skeletal structures are
unremarkable.
IMPRESSION: No acute abnormality of the lungs.

## 2021-08-02 ENCOUNTER — Encounter: Payer: Self-pay | Admitting: Family Medicine

## 2021-08-03 ENCOUNTER — Emergency Department (HOSPITAL_BASED_OUTPATIENT_CLINIC_OR_DEPARTMENT_OTHER): Payer: 59

## 2021-08-03 ENCOUNTER — Emergency Department (HOSPITAL_BASED_OUTPATIENT_CLINIC_OR_DEPARTMENT_OTHER)
Admission: EM | Admit: 2021-08-03 | Discharge: 2021-08-03 | Disposition: A | Discharge status: Home / Self Care | Payer: 59 | Attending: Emergency Medicine | Admitting: Emergency Medicine

## 2021-08-03 ENCOUNTER — Other Ambulatory Visit: Payer: Self-pay

## 2021-08-03 ENCOUNTER — Encounter (HOSPITAL_BASED_OUTPATIENT_CLINIC_OR_DEPARTMENT_OTHER): Payer: Self-pay | Admitting: Emergency Medicine

## 2021-08-03 DIAGNOSIS — J45909 Unspecified asthma, uncomplicated: Secondary | ICD-10-CM | POA: Diagnosis not present

## 2021-08-03 DIAGNOSIS — R1032 Left lower quadrant pain: Secondary | ICD-10-CM | POA: Diagnosis not present

## 2021-08-03 DIAGNOSIS — Z87891 Personal history of nicotine dependence: Secondary | ICD-10-CM | POA: Diagnosis not present

## 2021-08-03 DIAGNOSIS — K5900 Constipation, unspecified: Secondary | ICD-10-CM | POA: Insufficient documentation

## 2021-08-03 LAB — URINALYSIS, ROUTINE W REFLEX MICROSCOPIC
Bilirubin Urine: NEGATIVE
Glucose, UA: NEGATIVE mg/dL
Hgb urine dipstick: NEGATIVE
Ketones, ur: NEGATIVE mg/dL
Nitrite: NEGATIVE
Protein, ur: NEGATIVE mg/dL
Specific Gravity, Urine: 1.005 (ref 1.005–1.030)
pH: 6 (ref 5.0–8.0)

## 2021-08-03 LAB — CBC
HCT: 46.4 % (ref 39.0–52.0)
Hemoglobin: 15.9 g/dL (ref 13.0–17.0)
MCH: 29.3 pg (ref 26.0–34.0)
MCHC: 34.3 g/dL (ref 30.0–36.0)
MCV: 85.5 fL (ref 80.0–100.0)
Platelets: 279 10*3/uL (ref 150–400)
RBC: 5.43 MIL/uL (ref 4.22–5.81)
RDW: 13.7 % (ref 11.5–15.5)
WBC: 8.4 10*3/uL (ref 4.0–10.5)
nRBC: 0 % (ref 0.0–0.2)

## 2021-08-03 LAB — LIPASE, BLOOD: Lipase: 43 U/L (ref 11–51)

## 2021-08-03 LAB — COMPREHENSIVE METABOLIC PANEL
ALT: 45 U/L — ABNORMAL HIGH (ref 0–44)
AST: 33 U/L (ref 15–41)
Albumin: 4.8 g/dL (ref 3.5–5.0)
Alkaline Phosphatase: 82 U/L (ref 38–126)
Anion gap: 8 (ref 5–15)
BUN: 9 mg/dL (ref 6–20)
CO2: 28 mmol/L (ref 22–32)
Calcium: 9.6 mg/dL (ref 8.9–10.3)
Chloride: 99 mmol/L (ref 98–111)
Creatinine, Ser: 1.09 mg/dL (ref 0.61–1.24)
GFR, Estimated: >60 mL/min (ref >60)
Glucose, Bld: 89 mg/dL (ref 70–99)
Potassium: 3.8 mmol/L (ref 3.5–5.1)
Sodium: 135 mmol/L (ref 135–145)
Total Bilirubin: 0.9 mg/dL (ref 0.3–1.2)
Total Protein: 8.5 g/dL — ABNORMAL HIGH (ref 6.5–8.1)

## 2021-08-03 LAB — URINALYSIS, MICROSCOPIC (REFLEX)

## 2021-08-03 LAB — OCCULT BLOOD X 1 CARD TO LAB, STOOL: Fecal Occult Bld: NEGATIVE

## 2021-08-03 NOTE — Discharge Instructions (Addendum)
I would recommend purchasing MiraLAX.  This can be purchased over-the-counter.  Try 1 scoop in the morning and for your symptoms.  If this does not improve your symptoms you can increase this to 2 scoops per day.  Please make sure you are also drinking greater than 64 ounces of water per day.  I would also recommend increasing the fiber content of your diet with whole grains as well as fruits and vegetables.  If you develop any new or worsening symptoms please come back to the emergency department.  Otherwise, please follow-up with your regular doctor.  It was a pleasure to meet you.

## 2021-08-03 NOTE — ED Provider Notes (Signed)
[facility] EMERGENCY DEPARTMENT Provider Note   CSN: [id number] Arrival date & time: 08/03/21  1753     History Chief Complaint  Patient presents with   Constipation    Peter Key is a 30 y.o. male.  HPI Patient is a 30 year old male with a medical history as noted below.  He presents to the emergency department due to left lower quadrant pain for the past 2 days.  Patient states his symptoms have been worsening.  States that he has been constipated as well.  He notes a small bowel movement yesterday that he states was black.  Denies any bowel movement today.  Denies any nausea, vomiting, or urinary changes.    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   Smoker 08/29/2019   GERD (gastroesophageal reflux disease) 08/29/2019   Snores 08/29/2019    Past Surgical History:  Procedure Laterality Date   TONSILLECTOMY         Family History  Problem Relation Age of Onset   Hypertension Maternal Grandmother    Diabetes Maternal Grandmother    Hypertension Maternal Grandfather     Social History   Tobacco Use   Smoking status: Former    Packs/day: 0.25    Types: Cigarettes    Quit date: 10/18/2020    Years since quitting: 0.7   Smokeless tobacco: Never  Vaping Use   Vaping Use: Never used  Substance Use Topics   Alcohol use: Yes   Drug use: No    Home Medications Prior to Admission medications   Medication Sig Start Date End Date Taking? Authorizing Provider  candesartan (ATACAND) 16 MG tablet Take  1 tablet (16 mg total) by mouth at bedtime. 01/07/21   Nestor Ramp, MD  pantoprazole (PROTONIX) 40 MG tablet Take 1 tablet (40 mg total) by mouth daily. 06/08/21   Nestor Ramp, MD    Allergies    Patient has no known allergies.  Review of Systems   Review of Systems  All other systems reviewed and are negative. Ten systems reviewed and are negative for acute change, except as noted in the HPI.   Physical Exam Updated Vital Signs BP (!) 127/96 (BP Location: Right Arm)   Pulse 78   Temp 98.3 F (36.8 C) (Oral)   Resp 18   Ht 5\' 6"  (1.676 m)   Wt 104.3 kg   SpO2 99%   BMI 37.12 kg/m   Physical Exam Vitals and nursing note reviewed.  Constitutional:      General: He is not in acute distress.    Appearance: Normal appearance. He is not ill-appearing, toxic-appearing or diaphoretic.  HENT:     Head: Normocephalic and atraumatic.     Right Ear: External ear normal.     Left Ear: External ear normal.     Nose: Nose normal.     Mouth/Throat:     Mouth: Mucous membranes are moist.     Pharynx: Oropharynx is clear. No oropharyngeal exudate  or posterior oropharyngeal erythema.  Eyes:     Extraocular Movements: Extraocular movements intact.  Cardiovascular:     Rate and Rhythm: Normal rate and regular rhythm.     Pulses: Normal pulses.     Heart sounds: Normal heart sounds. No murmur heard.   No friction rub. No gallop.  Pulmonary:     Effort: Pulmonary effort is normal. No respiratory distress.     Breath sounds: Normal breath sounds. No stridor. No wheezing, rhonchi or rales.  Abdominal:     General: Abdomen is flat.     Palpations: Abdomen is soft.     Tenderness: There is abdominal tenderness.     Comments: Protuberant abdomen that is soft.  Mild tenderness noted along the left lower quadrant as well as left central lateral abdomen.  Genitourinary:    Comments: Male nursing chaperone present.  Normal-appearing anal region.  No visible or palpable hemorrhoids noted.   Moderate amount of loose dark green stool noted in the rectal vault.  No hematochezia.  No tenderness appreciated throughout the exam. Musculoskeletal:        General: Normal range of motion.     Cervical back: Normal range of motion and neck supple. No tenderness.  Skin:    General: Skin is warm and dry.  Neurological:     General: No focal deficit present.     Mental Status: He is alert and oriented to person, place, and time.  Psychiatric:        Mood and Affect: Mood normal.        Behavior: Behavior normal.   ED Results / Procedures / Treatments   Labs (all labs ordered are listed, but only abnormal results are displayed) Labs Reviewed  URINALYSIS, ROUTINE W REFLEX MICROSCOPIC - Abnormal; Notable for the following components:      Result Value   Leukocytes,Ua SMALL (*)    All other components within normal limits  COMPREHENSIVE METABOLIC PANEL - Abnormal; Notable for the following components:   Total Protein 8.5 (*)    ALT 45 (*)    All other components within normal limits  URINALYSIS, MICROSCOPIC (REFLEX) - Abnormal; Notable for the following components:   Bacteria, UA FEW (*)    All other components within normal limits  LIPASE, BLOOD  CBC  OCCULT BLOOD X 1 CARD TO LAB, STOOL  POC OCCULT BLOOD, ED   EKG None  Radiology CT ABDOMEN PELVIS WO CONTRAST  Result Date: 08/03/2021 CLINICAL DATA:  Melena. Left lower quadrant abdominal pain. Constipation for 1 week. EXAM: CT ABDOMEN AND PELVIS WITHOUT CONTRAST TECHNIQUE: Multidetector CT imaging of the abdomen and pelvis was performed following the standard protocol without IV contrast. COMPARISON:  None. FINDINGS: Lower chest: Lung bases are clear. Hepatobiliary: No focal liver abnormality is seen. No gallstones, gallbladder wall thickening, or biliary dilatation. Pancreas: Unremarkable. No pancreatic ductal dilatation or surrounding inflammatory changes. Spleen: Normal in size without focal abnormality. Adrenals/Urinary Tract:  Adrenal glands are unremarkable. Kidneys are normal, without renal calculi, focal lesion, or hydronephrosis. Bladder is unremarkable. Stomach/Bowel: Stomach and small bowel are decompressed. Scattered stool throughout the colon without significant colonic distention. Under distention limits evaluation but there is no obvious evidence of wall thickening or inflammatory stranding. The appendix is normal. Scattered right lower quadrant and mesenteric lymph nodes are not pathologically enlarged, likely reactive. Vascular/Lymphatic: No significant vascular findings are present. No enlarged abdominal or pelvic lymph nodes. Reproductive: Prostate is unremarkable. Other: No abdominal wall hernia or abnormality. No abdominopelvic  ascites. Musculoskeletal: No acute or significant osseous findings. IMPRESSION: No acute process demonstrated in the abdomen or pelvis. No evidence of bowel obstruction or inflammation. No cause identified to account for abdominal pain and rectal bleeding. Electronically Signed   By: Burman Nieves M.D.   On: 08/03/2021 22:38    Procedures Procedures   Medications Ordered in ED Medications - No data to display  ED Course  I have reviewed the triage vital signs and the nursing notes.  Pertinent labs & imaging results that were available during my care of the patient were reviewed by me and considered in my medical decision making (see chart for details).    MDM Rules/Calculators/A&P                          Pt is a 30 y.o. male who presents to the emergency department due to left lower quadrant pain, constipation, as well as black stools yesterday.  Labs: CBC without abnormalities. CMP with a total protein of 8.5 and an ALT of 45. UA with small leukocytes and few bacteria. Lipase of 43. Guaiac is negative.  Imaging: CT scan of the abdomen/pelvis without contrast shows no acute process demonstrated in the abdomen or pelvis.  No evidence of bowel obstruction or  inflammation.  No cause identified to account for abdominal pain and rectal bleeding.  I, Placido Sou, PA-C, personally reviewed and evaluated these images and lab results as part of my medical decision-making.  Physical exam significant for tenderness along the left lower quadrant as well as the central left lateral abdomen.  Abdomen is soft.  Rectal exam performed with chaperone present showing dark green stool in the rectal vault but no hematochezia.  Guaiac negative.  Obtained a CT scan which is negative.  CBC without leukocytosis.  Lipase within normal limits at 43.  No electrolyte derangements noted on CMP.  Unsure the source of the patient's symptoms.  Recommended increasing fiber in his diet as well as increasing water intake.  Also recommended the patient begin using MiraLAX as needed for his constipation.  We discussed dosing.  Feel that he is stable for discharge at this time and he is agreeable.  We discussed return cautions.  PCP follow-up.  His questions were answered and he was amicable at the time of discharge.  Note: Portions of this report may have been transcribed using voice recognition software. Every effort was made to ensure accuracy; however, inadvertent computerized transcription errors may be present.   Final Clinical Impression(s) / ED Diagnoses Final diagnoses:  LLQ pain  Constipation, unspecified constipation type   Rx / DC Orders ED Discharge Orders     None        Placido Sou, PA-C 08/03/21 2255    Cheryll Cockayne, MD 08/12/21 [phone]

## 2021-08-03 NOTE — ED Triage Notes (Signed)
Pt arrives pov with c/o constipation x 1 week, LLQ pain x 2 days radiating to lower back. Pt also c/o bright red blood in stool that started last night. Reports lax and prune juice not helping

## 2021-08-10 ENCOUNTER — Other Ambulatory Visit: Payer: Self-pay

## 2021-08-10 ENCOUNTER — Ambulatory Visit
Admission: RE | Admit: 2021-08-10 | Discharge: 2021-08-10 | Disposition: A | Discharge status: Home / Self Care | Payer: 59 | Source: Ambulatory Visit | Attending: Emergency Medicine | Admitting: Emergency Medicine

## 2021-08-10 VITALS — BP 129/91 | HR 110 | Temp 99.7°F | Wt 217.0 lb

## 2021-08-10 DIAGNOSIS — R52 Pain, unspecified: Secondary | ICD-10-CM | POA: Diagnosis not present

## 2021-08-10 DIAGNOSIS — B349 Viral infection, unspecified: Secondary | ICD-10-CM

## 2021-08-10 DIAGNOSIS — R519 Headache, unspecified: Secondary | ICD-10-CM | POA: Diagnosis not present

## 2021-08-10 LAB — POCT URINALYSIS DIP (MANUAL ENTRY)
Bilirubin, UA: NEGATIVE
Blood, UA: NEGATIVE
Glucose, UA: NEGATIVE mg/dL
Ketones, POC UA: NEGATIVE mg/dL
Leukocytes, UA: NEGATIVE
Nitrite, UA: NEGATIVE
Spec Grav, UA: >=1.03 — AB (ref 1.010–1.025)
Urobilinogen, UA: 0.2 E.U./dL
pH, UA: 5.5 (ref 5.0–8.0)

## 2021-08-10 NOTE — Discharge Instructions (Signed)
Please continue conservative care as you have been doing, Mucinex, NyQuil, ibuprofen, acetaminophen, Sudafed for your symptoms.  We will notify you of the results of your COVID and flu test once they are available, usually within the next 12 to 24 hours.  Follow-up as needed for worsening symptoms.

## 2021-08-10 NOTE — ED Triage Notes (Signed)
Pt c/o cough chest congestion and headache that started Saturday. Denies fever. Taking nyquil and mucinex.

## 2021-08-10 NOTE — ED Provider Notes (Signed)
[facility]    CSN: [id number] Arrival date & time: 08/10/21  1116      History   Chief Complaint Chief Complaint  Patient presents with   Cough    HPI Peter Key is a 30 y.o. male.   Patient complains of chest congestion, nonproductive cough and headache that started 2 days ago.  Patient states he is unsure whether or not he has been having fever but endorses chills.  Patient states he is tried some NyQuil and some Mucinex with mild relief of his symptoms.  Patient states he has not had much of an appetite, did not eat anything all day yesterday, has been resting in bed because he feels very tired.  Patient states he does not recall having anything similar to this in the past endorses a remote history of asthma, states he does not have allergies.    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 Authorizing Provider  candesartan (ATACAND) 16 MG tablet Take 1 tablet (16 mg total) by mouth at bedtime. 01/07/21   Nestor Ramp, MD  pantoprazole (PROTONIX) 40 MG tablet Take 1 tablet (40 mg total) by mouth daily. 06/08/21   Nestor Ramp, MD    Family History Family History  Problem Relation Age of Onset   Hypertension Maternal Grandmother    Diabetes  Maternal Grandmother    Hypertension Maternal Grandfather     Social History Social History   Tobacco Use   Smoking status: Former    Packs/day: 0.25    Types: Cigarettes    Quit date: 10/18/2020    Years since quitting: 0.8   Smokeless tobacco: Never  Vaping Use   Vaping Use: Never used  Substance Use Topics   Alcohol use: Yes   Drug use: No     Allergies   Patient has no known allergies.   Review of Systems Review of Systems Pertinent findings noted in history of present illness.    Physical Exam Triage Vital Signs ED Triage Vitals  Enc Vitals Group     BP      Pulse      Resp      Temp      Temp src      SpO2      Weight      Height      Head Circumference      Peak Flow      Pain Score      Pain Loc      Pain Edu?      Excl. in GC?    No data found.  Updated Vital Signs BP (!) 129/91 (BP Location: Right Arm)  Pulse (!) 110   Temp 99.7 F (37.6 C) (Oral)   Wt 217 lb (98.4 kg)   SpO2 96%   BMI 35.02 kg/m   Visual Acuity Right Eye Distance:   Left Eye Distance:   Bilateral Distance:    Right Eye Near:   Left Eye Near:    Bilateral Near:     Physical Exam Vitals and nursing note reviewed.  Constitutional:      Appearance: Normal appearance.  HENT:     Head: Normocephalic and atraumatic.     Right Ear: Ear canal and external ear normal. Tympanic membrane is bulging.     Left Ear: Ear canal and external ear normal. Tympanic membrane is bulging.     Nose: Nose normal.     Right Turbinates: Enlarged and swollen.     Left Turbinates: Enlarged and swollen.     Mouth/Throat:     Mouth: Mucous membranes are moist.     Pharynx: Oropharynx is clear. No pharyngeal swelling, oropharyngeal exudate, posterior oropharyngeal erythema or uvula swelling.     Tonsils: No tonsillar exudate. 0 on the right. 0 on the left.  Eyes:     Extraocular Movements: Extraocular movements intact.     Conjunctiva/sclera: Conjunctivae normal.     Pupils: Pupils  are equal, round, and reactive to light.  Cardiovascular:     Rate and Rhythm: Normal rate and regular rhythm.     Heart sounds: Normal heart sounds.  Pulmonary:     Effort: Pulmonary effort is normal.     Breath sounds: Normal breath sounds.  Abdominal:     General: Abdomen is flat. Bowel sounds are normal.     Palpations: Abdomen is soft.  Musculoskeletal:        General: Normal range of motion.     Cervical back: Normal range of motion and neck supple.  Lymphadenopathy:     Cervical: Cervical adenopathy present.  Skin:    General: Skin is warm and dry.  Neurological:     General: No focal deficit present.     Mental Status: He is alert and oriented to person, place, and time.  Psychiatric:        Mood and Affect: Mood normal.        Behavior: Behavior normal.     UC Treatments / Results  Labs (all labs ordered are listed, but only abnormal results are displayed) Labs Reviewed  COVID-19, FLU A+B NAA    EKG   Radiology No results found.  Procedures Procedures (including critical care time)  Medications Ordered in UC Medications - No data to display  Initial Impression / Assessment and Plan / UC Course  I have reviewed the triage vital signs and the nursing notes.  Pertinent labs & imaging results that were available during my care of the patient were reviewed by me and considered in my medical decision making (see chart for details).     We have tested patient for COVID influenza due to his symptoms being consistent with either 1 of these infections.  Continued conservative care is recommended, push clear fluids, rest, Tylenol, ibuprofen, Mucinex and Sudafed.  Patient advised to be notified of his test results once they are available.    Patient verbalized understanding and agreement of plan as discussed.  All questions were addressed during visit.  Please see discharge instructions below for further details of plan.  Final Clinical Impressions(s) / UC  Diagnoses   Final diagnoses:  Nonintractable headache, unspecified chronicity pattern,  unspecified headache type  Body aches  Viral illness     Discharge Instructions      Please continue conservative care as you have been doing, Mucinex, NyQuil, ibuprofen, acetaminophen, Sudafed for your symptoms.  We will notify you of the results of your COVID and flu test once they are available, usually within the next 12 to 24 hours.  Follow-up as needed for worsening symptoms.     ED Prescriptions   None    PDMP not reviewed this encounter.   Theadora Rama Scales, PA-C 08/10/21 1157

## 2021-08-11 LAB — COVID-19, FLU A+B NAA
Influenza A, NAA: DETECTED — AB
Influenza B, NAA: NOT DETECTED
SARS-CoV-2, NAA: NOT DETECTED

## 2021-08-12 ENCOUNTER — Ambulatory Visit: Payer: 59

## 2021-08-12 ENCOUNTER — Ambulatory Visit (HOSPITAL_BASED_OUTPATIENT_CLINIC_OR_DEPARTMENT_OTHER)
Admission: RE | Admit: 2021-08-12 | Discharge: 2021-08-12 | Disposition: A | Discharge status: Home / Self Care | Payer: 59 | Source: Ambulatory Visit | Attending: Emergency Medicine | Admitting: Emergency Medicine

## 2021-08-12 ENCOUNTER — Ambulatory Visit
Admission: EM | Admit: 2021-08-12 | Discharge: 2021-08-12 | Disposition: A | Discharge status: Home / Self Care | Payer: 59 | Attending: Emergency Medicine | Admitting: Emergency Medicine

## 2021-08-12 ENCOUNTER — Other Ambulatory Visit: Payer: Self-pay

## 2021-08-12 DIAGNOSIS — R0789 Other chest pain: Secondary | ICD-10-CM | POA: Insufficient documentation

## 2021-08-12 DIAGNOSIS — J09X2 Influenza due to identified novel influenza A virus with other respiratory manifestations: Secondary | ICD-10-CM | POA: Diagnosis not present

## 2021-08-12 DIAGNOSIS — J111 Influenza due to unidentified influenza virus with other respiratory manifestations: Secondary | ICD-10-CM | POA: Diagnosis present

## 2021-08-12 NOTE — ED Triage Notes (Signed)
Pt states he  is having pressure to his chest when lying down (difficulty breathing). Patient states that it is better now but still somewhat tight.

## 2021-08-12 NOTE — ED Provider Notes (Signed)
[facility]    CSN: [id number] Arrival date & time: 08/12/21  1319      History   Chief Complaint Chief Complaint  Patient presents with   Chest Pain    HPI Peter Key is a 30 y.o. male.   Patient was diagnosed with influenza A on August 10, 2021, 2 days ago.  Patient returns today complaining of chest tightness when lying down.  Patient states he sleeps on his back usually and was unable to get any sleep last night in his bed, states he had to get up and sit in a recliner but still was unable to sleep well.  Patient states that the tightness has improved since getting up today, denies difficulty breathing at this time.  Patient states his other symptoms which were present 2 days ago have mostly resolved.  Patient's vital signs are stable on arrival today.  The history is provided by the patient.   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 Authorizing Provider  candesartan (ATACAND) 16 MG tablet Take 1 tablet (16 mg total) by mouth at bedtime. 01/07/21   Nestor Ramp, MD  pantoprazole (PROTONIX) 40 MG tablet Take 1 tablet (40 mg total) by mouth daily. 06/08/21   Nestor Ramp, MD    Family History Family History  Problem Relation Age of Onset    Hypertension Maternal Grandmother    Diabetes Maternal Grandmother    Hypertension Maternal Grandfather     Social History Social History   Tobacco Use   Smoking status: Former    Packs/day: 0.25    Types: Cigarettes    Quit date: 10/18/2020    Years since quitting: 0.8   Smokeless tobacco: Never  Vaping Use   Vaping Use: Never used  Substance Use Topics   Alcohol use: Yes   Drug use: No     Allergies   Patient has no known allergies.   Review of Systems Review of Systems Pertinent findings noted in history of present illness.    Physical Exam Triage Vital Signs ED Triage Vitals  Enc Vitals Group     BP      Pulse      Resp      Temp      Temp src      SpO2      Weight      Height      Head Circumference      Peak Flow      Pain Score      Pain Loc      Pain Edu?      Excl. in GC?    No data found.  Updated  Vital Signs BP 117/88   Pulse 94   Temp 99.9 F (37.7 C) (Oral)   Resp 20   Wt 222 lb (100.7 kg)   SpO2 96%   BMI 35.83 kg/m   Visual Acuity Right Eye Distance:   Left Eye Distance:   Bilateral Distance:    Right Eye Near:   Left Eye Near:    Bilateral Near:     Physical Exam Vitals and nursing note reviewed.  Constitutional:      Appearance: Normal appearance.  HENT:     Head: Normocephalic and atraumatic.     Right Ear: Tympanic membrane, ear canal and external ear normal.     Left Ear: Tympanic membrane, ear canal and external ear normal.     Nose: Nose normal.     Mouth/Throat:     Mouth: Mucous membranes are moist.     Pharynx: Oropharynx is clear.  Eyes:     Extraocular Movements: Extraocular movements intact.     Conjunctiva/sclera: Conjunctivae normal.     Pupils: Pupils are equal, round, and reactive to light.  Cardiovascular:     Rate and Rhythm: Normal rate and regular rhythm.     Heart sounds: Normal heart sounds.  Pulmonary:     Effort: Pulmonary effort is normal.     Breath sounds: Normal breath sounds.   Abdominal:     Palpations: Abdomen is soft.  Musculoskeletal:     Cervical back: Normal range of motion and neck supple.  Skin:    General: Skin is warm and dry.  Neurological:     General: No focal deficit present.     Mental Status: He is alert and oriented to person, place, and time.  Psychiatric:        Mood and Affect: Mood normal.        Behavior: Behavior normal.     UC Treatments / Results  Labs (all labs ordered are listed, but only abnormal results are displayed) Labs Reviewed - No data to display  EKG   Radiology DG Chest 2 View  Result Date: 08/13/2021 CLINICAL DATA:  Positive for influenza. EXAM: CHEST - 2 VIEW COMPARISON:  11/18/2020; 09/22/2020 FINDINGS: Grossly unchanged cardiac silhouette and mediastinal contours. No focal parenchymal opacities. No pleural effusion or pneumothorax. No evidence of edema. No acute osseous abnormalities. IMPRESSION: No acute cardiopulmonary disease. Specifically, no evidence of pneumonia. Electronically Signed   By: Simonne Come M.D.   On: 08/13/2021 11:57    Procedures Procedures (including critical care time)  Medications Ordered in UC Medications - No data to display  Initial Impression / Assessment and Plan / UC Course  I have reviewed the triage vital signs and the nursing notes.  Pertinent labs & imaging results that were available during my care of the patient were reviewed by me and considered in my medical decision making (see chart for details).     X-ray ordered as requested by patient.  Patient was advised to sleep prone.  Patient was advised to continue to follow conservative measures till feeling better.  Patient verbalized understanding and agreement of plan as discussed.  All questions were addressed during visit.  Please see discharge instructions below for further details of plan.  Final Clinical Impressions(s) / UC Diagnoses   Final diagnoses:  Influenza due to identified novel influenza A virus with  other respiratory manifestations  Sensation of chest tightness     Discharge Instructions      X-ray has been ordered at the  [facility] imaging center at your request.  As we discussed, you may find that you have more comfort and ease of breathing if Your stomach when you are sleeping at night, anatomically this helps along stay inflated more easily and reduces your risk of pneumonia.     ED Prescriptions   None    PDMP not reviewed this encounter.   Theadora Rama Scales, PA-C 08/14/21 [phone]

## 2021-08-12 NOTE — Discharge Instructions (Addendum)
X-ray has been ordered at the [facility] imaging center at your request.  As we discussed, you may find that you have more comfort and ease of breathing if Your stomach when you are sleeping at night, anatomically this helps along stay inflated more easily and reduces your risk of pneumonia.

## 2021-09-07 ENCOUNTER — Encounter: Payer: Self-pay | Admitting: *Deleted

## 2021-09-08 ENCOUNTER — Ambulatory Visit: Payer: 59 | Admitting: Family Medicine

## 2021-09-09 ENCOUNTER — Ambulatory Visit: Payer: 59 | Admitting: Family Medicine

## 2021-09-16 ENCOUNTER — Ambulatory Visit (INDEPENDENT_AMBULATORY_CARE_PROVIDER_SITE_OTHER): Payer: 59 | Admitting: Family Medicine

## 2021-09-16 ENCOUNTER — Encounter: Payer: Self-pay | Admitting: Family Medicine

## 2021-09-16 ENCOUNTER — Other Ambulatory Visit: Payer: Self-pay

## 2021-09-16 VITALS — BP 128/90 | HR 88 | Ht 66.0 in | Wt 216.0 lb

## 2021-09-16 DIAGNOSIS — K219 Gastro-esophageal reflux disease without esophagitis: Secondary | ICD-10-CM

## 2021-09-16 DIAGNOSIS — K5909 Other constipation: Secondary | ICD-10-CM | POA: Diagnosis not present

## 2021-09-16 NOTE — Progress Notes (Signed)
    CHIEF COMPLAINT / HPI: Abdominal pain.  Was seen in the emergency department had a CT scan last month.  He is having bowel movements daily but they are small and content.  He gets some relief from that.  He has a lot of crampy colicky counter pain right before the bowel movement and does not have total relief after having bowel movement.  No blood in the stool.  He is generally felt blocked and bloated.  Says he is getting a little depressed about all of this.  He has been trying to eat higher fiber foods.   PERTINENT  PMH / PSH: I have reviewed the patient's medications, allergies, past medical and surgical history, smoking status and updated in the EMR as appropriate.   OBJECTIVE:  BP 128/90   Pulse 88   Ht 5\' 6"  (1.676 m)   Wt 216 lb (98 kg)   SpO2 99%   BMI 34.86 kg/m  GENERAL: Well-developed male no acute distress abdomen: Soft, positive bowel sounds nontender nondistended no rebound or guarding. IMAGING: Reviewed CT scan August 15, 2021.  Showed significant stool burden.  ASSESSMENT / PLAN:   No problem-specific Assessment & Plan notes found for this encounter.   August 17, 2021 MD

## 2021-09-16 NOTE — Patient Instructions (Signed)
Start your fiber supplement DDAILY. Start at one half pack a day with a full glass of water and in five days go to one whole pack, Then 5 days after hat go to one and one half pack and in the fifth day after that go to two packs (or scoopes ) a day. Stay at that until you see the GI doctor.  Let  me see you back in January  Fiber Content in Foods Fiber is a substance that is found in plant foods, such as fruits, vegetables, whole grains, nuts, seeds, and beans. As part of your treatment and recovery plan, your health care provider may recommend that you eat foods that have specific amounts of dietary fiber. Some conditions may require a high-fiber diet while others may require a low-fiber diet. This sheet gives you information about the dietary fiber content of some common foods. Your health care provider will tell you how much fiber you need in your diet. If you have problems or questions, contact your health care provider or dietitian. What foods are high in fiber? Fruits Blackberries or raspberries (fresh) --  cup (75 g) has 4 g of fiber. Pear (fresh) -- 1 medium (180 g) has 5.5 g of fiber. Prunes (dried) -- 6 to 8 pieces (57-76 g) has 5 g of fiber. Apple with skin -- 1 medium (182 g) has 4.8 g of fiber. Guava -- 1 cup (128 g) has 8.9 g of fiber. Vegetables Peas (frozen) --  cup (80 g) has 4.4 g of fiber. Potato with skin (baked) -- 1 medium (173 g) has 4.4 g of fiber. Pumpkin (canned) --  cup (122 g) has 5 g of fiber. Brussels sprouts (cooked) --  cup (78 g) has 4 g of fiber. Sweet potato --  cup mashed (124 g) has 4 g of fiber. Winter squash -- 1 cup cooked (205 g) has 5.7 g of fiber. Grains Bran cereal --  cup (31 g) has 8.6 g of fiber. Bulgur (cooked) --  cup (70 g) has 4 g of fiber. Quinoa (cooked) -- 1 cup (185 g) has 5.2 g of fiber. Popcorn -- 3 cups (375 g) popped has 5.8 g of fiber. Spaghetti, whole wheat -- 1 cup (140 g) has 6 g of fiber. Meats and other  proteins Pinto beans (cooked) --  cup (90 g) has 7.7 g of fiber. Lentils (cooked) --  cup (90 g) has 7.8 g of fiber. Kidney beans (canned) --  cup (92.5 g) has 5.7 g of fiber. Soybeans (canned, frozen, or fresh) --  cup (92.5 g) has 5.2 g of fiber. Baked beans, plain or vegetarian (canned) --  cup (130 g) has 5.2 g of fiber. Garbanzo beans or chickpeas (canned) --  cup (90 g) has 6.6 g of fiber. Black beans (cooked) --  cup (86 g) has 7.5 g of fiber. White beans or navy beans (cooked) --  cup (91 g) has 9.3 g of fiber. The items listed above may not be a complete list of foods with high fiber. Actual amounts of fiber may be different depending on processing. Contact a dietitian for more information. What foods are moderate in fiber? Fruits Banana -- 1 medium (126 g) has 3.2 g of fiber. Melon -- 1 cup (155 g) has 1.4 g of fiber. Orange -- 1 small (154 g) has 3.7 g of fiber. Raisins --  cup (40 g) has 1.8 g of fiber. Applesauce, sweetened --  cup (125 g) has 1.5  g of fiber. Blueberries (fresh) --  cup (75 g) has 1.8 g of fiber. Strawberries (fresh, sliced) -- 1 cup (150 g) has 3 g of fiber. Cherries -- 1 cup (140 g) has 2.9 g of fiber. Vegetables Broccoli (cooked) --  cup (77.5 g) has 2.1 g of fiber. Carrots (cooked) --  cup (77.5 g) has 2.2 g of fiber. Corn (canned or frozen) --  cup (82.5 g) has 2.1 g of fiber. Potatoes, mashed --  cup (105 g) has 1.6 g of fiber. Tomato -- 1 medium (62 g) has 1.5 g of fiber. Green beans (canned) --  cup (83 g) has 2 g of fiber. Squash, winter --  cup (58 g) has 1 g of fiber. Sweet potato, baked -- 1 medium (150 g) has 3 g of fiber. Cauliflower (cooked) -- 1/2 cup (90 g) has 2.3 g of fiber. Grains Long-grain brown rice (cooked) -- 1 cup (196 g) has 3.5 g of fiber. Bagel, plain -- one 4-inch (10 cm) bagel has 2 g of fiber. Instant oatmeal --  cup (120 g) has about 2 g of fiber. Macaroni noodles, enriched (cooked) -- 1 cup (140 g) has  2.5 g of fiber. Multigrain cereal --  cup (15 g) has about 2-4 g of fiber. Whole-wheat bread -- 1 slice (26 g) has 2 g of fiber. Whole-wheat spaghetti noodles --  cup (70 g) has 3.2 g of fiber. Corn tortilla -- one 6-inch (15 cm) tortilla has 1.5 g of fiber. Meats and other proteins Almonds --  cup or 1 oz (28 g) has 3.5 g of fiber. Sunflower seeds in shell --  cup or  oz (11.5 g) has 1.1 g of fiber. Vegetable or soy patty -- 1 patty (70 g) has 3.4 g of fiber. Walnuts --  cup or 1 oz (30 g) has 2 g of fiber. Flax seed -- 1 Tbsp (7 g) has 2.8 g of fiber. The items listed above may not be a complete list of foods that have moderate amounts of fiber. Actual amounts of fiber may be different depending on processing. Contact a dietitian for more information. What foods are low in fiber? Low-fiber foods contain less than 1 g of fiber per serving. They include: Fruits Fruit juice --  cup or 4 fl oz (118 mL) has 0.5 g of fiber. Vegetables Lettuce -- 1 cup (35 g) has 0.5 g of fiber. Cucumber (slices) --  cup (60 g) has 0.3 g of fiber. Celery -- 1 stalk (40 g) has 0.1 g of fiber. Grains Flour tortilla -- one 6-inch (15 cm) tortilla has 0.5 g of fiber. White rice (cooked) --  cup (81.5 g) has 0.3 g of fiber. Meats and other proteins Egg -- 1 large (50 g) has 0 g of fiber. Meat, poultry, or fish -- 3 oz (85 g) has 0 g of fiber. Dairy Milk -- 1 cup or 8 fl oz (237 mL) has 0 g of fiber. Yogurt -- 1 cup (245 g) has 0 g of fiber. The items listed above may not be a complete list of foods that are low in fiber. Actual amounts of fiber may be different depending on processing. Contact a dietitian for more information. Summary Fiber is a substance that is found in plant foods, such as fruits, vegetables, whole grains, nuts, seeds, and beans. As part of your treatment and recovery plan, your health care provider may recommend that you eat foods that have specific amounts of dietary fiber.  This  information is not intended to replace advice given to you by your health care provider. Make sure you discuss any questions you have with your health care provider. Document Revised: 02/14/2020 Document Reviewed: 02/14/2020 Elsevier Patient Education  2022 [employer].

## 2021-09-16 NOTE — Assessment & Plan Note (Signed)
Long discussion.  He did not fully understand how the fiber work so has not been taking it daily.  It also makes him cramp a little bit more and he was worried about that.  I gave him a list of high-fiber foods.  We discussed this diagnosis in detail.  I do think he might benefit from prokinetic agent so I will refer him to gastroenterology for their evaluation of that.  In the interim, I gave him a gradual taper up to 2 scoops of fiber a day.  He asked about whether or not the proton pump inhibitor could be causing this and I told him it may be contributing.  I will let GI weigh in on that as well.  I will see him back in January.  Time sent with patient 30 minutes.

## 2021-09-16 NOTE — Assessment & Plan Note (Signed)
For now we will continue his pantoprazole.  GI referral placed.

## 2021-10-05 ENCOUNTER — Emergency Department (HOSPITAL_BASED_OUTPATIENT_CLINIC_OR_DEPARTMENT_OTHER): Payer: 59

## 2021-10-05 ENCOUNTER — Emergency Department (HOSPITAL_BASED_OUTPATIENT_CLINIC_OR_DEPARTMENT_OTHER)
Admission: EM | Admit: 2021-10-05 | Discharge: 2021-10-05 | Disposition: A | Discharge status: Home / Self Care | Payer: 59 | Attending: Emergency Medicine | Admitting: Emergency Medicine

## 2021-10-05 ENCOUNTER — Encounter (HOSPITAL_BASED_OUTPATIENT_CLINIC_OR_DEPARTMENT_OTHER): Payer: Self-pay | Admitting: *Deleted

## 2021-10-05 ENCOUNTER — Other Ambulatory Visit: Payer: Self-pay

## 2021-10-05 DIAGNOSIS — J45909 Unspecified asthma, uncomplicated: Secondary | ICD-10-CM | POA: Diagnosis not present

## 2021-10-05 DIAGNOSIS — Z79899 Other long term (current) drug therapy: Secondary | ICD-10-CM | POA: Insufficient documentation

## 2021-10-05 DIAGNOSIS — Z87891 Personal history of nicotine dependence: Secondary | ICD-10-CM | POA: Insufficient documentation

## 2021-10-05 DIAGNOSIS — R0602 Shortness of breath: Secondary | ICD-10-CM

## 2021-10-05 DIAGNOSIS — I10 Essential (primary) hypertension: Secondary | ICD-10-CM | POA: Insufficient documentation

## 2021-10-05 DIAGNOSIS — Z8616 Personal history of COVID-19: Secondary | ICD-10-CM | POA: Insufficient documentation

## 2021-10-05 NOTE — Progress Notes (Signed)
RT listened to patient in the waiting area. BBS are clear and diminished. Pulse ox read 96 on room air.

## 2021-10-05 NOTE — ED Provider Notes (Signed)
[facility] EMERGENCY DEPARTMENT Provider Note   CSN: [id number] Arrival date & time: 10/05/21  1642     History Chief Complaint  Patient presents with   Shortness of Breath    Peter Key is a 30 y.o. male.  Peter Key is a 30 y.o. male with a history of hypertension, asthma, anxiety, who presents to the ED for evaluation of shortness of breath.  Patient reports he had a COVID infection about 2 weeks ago, with this he experienced fevers, headache, and cough.  Did not have much shortness of breath during COVID infection but for the past week he has intermittently experienced episodes where he feels a bit short of breath and feels like he needs to take a deep breath.  These episodes seem to be brief.  He denies associated chest pain.  No lower extremity swelling or pain.  Symptoms do not seem to come on with exertion.  No associated continued cough or fevers.  Currently is not experiencing chest pain or shortness of breath and is talkative with no increased work of breathing.  He reports that his partner was seen in the emergency department today as well and tested positive for COVID once again which concerned him that he could have another COVID infection as well.  The history is provided by the patient and medical records.      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   Smoker 08/29/2019   GERD (gastroesophageal reflux disease) 08/29/2019   Snores 08/29/2019    Past Surgical  History:  Procedure Laterality Date   TONSILLECTOMY         Family History  Problem Relation Age of Onset   Hypertension Maternal Grandmother    Diabetes Maternal Grandmother    Hypertension Maternal Grandfather     Social History   Tobacco Use   Smoking status: Former    Packs/day: 0.25    Types: Cigarettes    Quit date: 10/18/2020    Years since quitting: 0.9   Smokeless tobacco: Never  Vaping Use   Vaping Use: Never used  Substance Use Topics   Alcohol use: Not Currently   Drug use: No    Home Medications Prior to Admission medications   Medication Sig Start Date End Date Taking? Authorizing Provider  candesartan (ATACAND) 16 MG tablet Take 1 tablet (16 mg total) by mouth at bedtime. 01/07/21   Nestor Ramp, MD  pantoprazole (PROTONIX) 40 MG tablet Take 1 tablet (40 mg total) by mouth daily. 06/08/21   Nestor Ramp, MD    Allergies    Patient has no known allergies.  Review of Systems   Review of Systems  Constitutional:  Negative for chills and fever.  HENT: Negative.    Respiratory:  Positive for shortness of breath. Negative for cough.   Cardiovascular:  Negative for chest pain and leg swelling.  Gastrointestinal:  Negative for abdominal pain, nausea and vomiting.  Neurological:  Negative for syncope and light-headedness.  All other systems reviewed and are negative.  Physical Exam Updated Vital Signs BP 129/90 (BP Location: Right Arm)   Pulse 96   Temp 98.3 F (36.8 C) (Oral)   Resp 18   Ht 5\' 6"  (1.676 m)   Wt 98 kg   SpO2 98%   BMI 34.87 kg/m   Physical Exam Vitals and nursing note reviewed.  Constitutional:      General: He is not in acute distress.    Appearance: Normal appearance. He is well-developed. He is not ill-appearing or diaphoretic.  HENT:     Head: Normocephalic and atraumatic.  Eyes:     General:        Right eye: No discharge.        Left eye: No discharge.     Pupils: Pupils are equal, round, and reactive to light.   Cardiovascular:     Rate and Rhythm: Normal rate and regular rhythm.     Pulses: Normal pulses.     Heart sounds: Normal heart sounds.  Pulmonary:     Effort: Pulmonary effort is normal. No respiratory distress.     Breath sounds: Normal breath sounds. No wheezing or rales.     Comments: Respirations equal and unlabored, patient able to speak in full sentences, lungs clear to auscultation bilaterally  Chest:     Chest wall: No tenderness.  Abdominal:     General: Bowel sounds are normal. There is no distension.     Palpations: Abdomen is soft. There is no mass.     Tenderness: There is no abdominal tenderness. There is no guarding.     Comments: Abdomen soft, nondistended, nontender to palpation in all quadrants without guarding or peritoneal signs  Musculoskeletal:        General: No deformity.     Cervical back: Neck supple.     Right lower leg: No edema.     Left lower leg: No edema.  Skin:    General: Skin is warm and dry.     Capillary Refill: Capillary refill takes less than 2 seconds.  Neurological:     Mental Status: He is alert and oriented to person, place, and time.     Coordination: Coordination normal.     Comments: Speech is clear, able to follow commands Moves extremities without ataxia, coordination intact  Psychiatric:        Mood and Affect: Mood normal.        Behavior: Behavior normal.    ED Results / Procedures / Treatments   Labs (all labs ordered are listed, but only abnormal results are displayed) Labs Reviewed - No data to display  EKG EKG Interpretation  Date/Time:  Monday October 05 2021 19:40:29 EST Ventricular Rate:  74 PR Interval:  174 QRS Duration: 96 QT Interval:  379 QTC Calculation: 421 R Axis:   96 Text Interpretation: Sinus rhythm Borderline right axis deviation Borderline ST elevation, lateral leads No significant change since last tracing Confirmed by 12-07-2001 [phone]) on 10/05/2021 8:20:51 PM  Radiology DG Chest 2  View  Result Date: 10/05/2021 CLINICAL DATA:  Shortness of breath. EXAM: CHEST - 2 VIEW COMPARISON:  PA and lateral 08/12/2021 FINDINGS: The heart size and mediastinal contours are within normal limits. Both lungs are clear. The visualized skeletal structures are unremarkable. IMPRESSION: No active cardiopulmonary disease or interval changes. Electronically Signed   By: 08/14/2021 M.D.   On: 10/05/2021 20:01    Procedures Procedures  Medications Ordered in ED Medications - No data to display  ED Course  I have reviewed the triage vital signs and the nursing notes.  Pertinent labs & imaging results that were available during my care of the patient were reviewed by me and considered in my medical decision making (see chart for details).    MDM Rules/Calculators/A&P                           30 year old male presents with intermittent episodes where he briefly feels short of breath, taking a deep breath seems to improve the symptoms, he has not had associated chest pain.  He had a COVID infection 2 weeks ago that was mild and though symptoms have resolved, did not experience shortness of breath during COVID.  On arrival he is alert, talkative with no increased work of breathing or respiratory distress, lungs are clear.  Chest x-ray with no active cardiopulmonary disease and EKG shows sinus rhythm without concerning ischemic changes.  Given the symptoms seem to be brief and intermittent patient has no tachycardia, tachypnea or hypoxia I have very low suspicion for PE.  Recommend continued supportive care and close follow-up with PCP if symptoms or not improving.  Return precautions provided.  Final Clinical Impression(s) / ED Diagnoses Final diagnoses:  SOB (shortness of breath)    Rx / DC Orders ED Discharge Orders     None        Legrand Rams 10/06/21 1353    Benjiman Core, MD 10/08/21 [phone]

## 2021-10-05 NOTE — Discharge Instructions (Signed)
Your vitals, chest x-ray and EKG are reassuring today.  The symptoms may be due to recent COVID infection, follow-up with your PCP if symptoms or not improving.  If you develop more persistent shortness of breath or chest pain return for reevaluation.

## 2021-10-05 NOTE — ED Notes (Signed)
Pt. Reports being here due to shortness of breath. Pt. In no distress and states he has a partner with positive Covid.

## 2021-10-05 NOTE — ED Triage Notes (Signed)
Sob. Covid 2 weeks ago. He is ambulatory and very talkative.

## 2021-11-18 ENCOUNTER — Ambulatory Visit: Payer: 59 | Admitting: Family Medicine

## 2021-12-23 ENCOUNTER — Encounter: Payer: Self-pay | Admitting: Internal Medicine

## 2021-12-23 ENCOUNTER — Ambulatory Visit: Payer: 59 | Admitting: Internal Medicine

## 2021-12-23 VITALS — BP 120/70 | HR 90 | Ht 66.0 in | Wt 211.0 lb

## 2021-12-23 DIAGNOSIS — K5900 Constipation, unspecified: Secondary | ICD-10-CM | POA: Diagnosis not present

## 2021-12-23 DIAGNOSIS — R1013 Epigastric pain: Secondary | ICD-10-CM

## 2021-12-23 DIAGNOSIS — K625 Hemorrhage of anus and rectum: Secondary | ICD-10-CM | POA: Diagnosis not present

## 2021-12-23 MED ORDER — NA SULFATE-K SULFATE-MG SULF 17.5-3.13-1.6 GM/177ML PO SOLN: 1.0000 | Freq: Once | ORAL | 0 refills | Status: AC

## 2021-12-23 NOTE — Patient Instructions (Signed)
If you are age 32 or younger, your body mass index should be between 19-25. Your Body mass index is 34.06 kg/m?Marland Kitchen If this is out of the aformentioned range listed, please consider follow up with your Primary Care Provider.  ?________________________________________________________ ? ?The [facility] providers would like to encourage you to use [facility] to communicate with providers for non-urgent requests or questions.  Due to long hold times on the telephone, sending your provider a message by [facility] may be a faster and more efficient way to get a response.  Please allow 48 business hours for a response.  Please remember that this is for non-urgent requests.  ?_______________________________________________________ ? ?You have been scheduled for an endoscopy and colonoscopy. Please follow the written instructions given to you at your visit today. ?Please pick up your prep supplies at the pharmacy within the next 1-3 days. ?If you use inhalers (even only as needed), please bring them with you on the day of your procedure. ? ?Try drinking 8 glasses of water daily. ?Walk at least 30 minutes daily. ? ?Use 1 capful of Miralax in 8 ounces of water or juice daily. You may titrate to 3 times daily. ? ?Follow up pending the results of your Colonoscopy/Endoscopy or as needed. ? ?Thank you for entrusting me with your care and choosing [facility]. ? ?Dr. Leonides Schanz ?

## 2021-12-23 NOTE — Progress Notes (Signed)
? ?Chief Complaint: Constipation ? ?HPI : 31 year old male with history of OSA, anxiety, GERD, and childhood asthma presents with constipation. ? ?He started having constipation issues in 09/2021 so he was instructed to start taking Metamucil daily. He has been having issues with ab pain and seeing some blood coated onto the stool. His ab pain is all over his abdomen. His stools have changed in consistency since 09/2021 with thinner stools. He does not feel like he is fully emptying himself out when he does have a BM. When he eats, he feels like he gets full really easily. He also feels bloated. He takes PPI as needed for GERD, which keeps his reflux symptoms under reasonable control. On average he has 4 BMs per week. He feels like his stools are not as substantial as they were previously. He started a herbal detox medication around the time that the constipation issues started, which he has not continued taking. Denies prior anal trauma.  Denies using digital stimulation to have a BM.  He has tried Dulcolax in the past but did not like this medication.  He has not tried any other laxatives.  He drinks a good amount of water. 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 He has to strain when he has a BM. Denies dysphagia or N&V. Denies fam hx of GI cancers. Weight has been up and down. Denies prior EGD or colonoscopy.  He does feel like he has had more stressors recently related to his family. ? ?Wt Readings from Last 3 Encounters:  ?12/23/21 211 lb (95.7 kg)  ?10/05/21 216 lb 0.8 oz (98 kg)  ?09/16/21 216 lb (98 kg)  ? ?Past Medical History:  ?Diagnosis Date  ? Anxiety   ? Asthma   ? Sleep apnea   ? uses a C-pap  ? ?Past Surgical History:  ?Procedure Laterality Date  ? TONSILLECTOMY    ? ?Family History  ?Problem Relation Age of Onset  ? Hypertension Maternal Grandmother   ? Diabetes Maternal Grandmother   ? Hypertension Maternal Grandfather   ? Colon cancer Neg Hx    ? Esophageal cancer Neg Hx   ? Rectal cancer Neg Hx   ? ?Social History  ? ?Tobacco Use  ? Smoking status: Former  ?  Packs/day: 0.25  ?  Types: Cigarettes  ?  Quit date: 10/18/2020  ?  Years since quitting: 1.1  ? Smokeless tobacco: Never  ?Vaping Use  ? Vaping Use: Never used  ?Substance Use Topics  ? Alcohol use: Not Currently  ? Drug use: No  ? ?Current Outpatient Medications  ?Medication Sig Dispense Refill  ? candesartan (ATACAND) 16 MG tablet Take 1 tablet (16 mg total) by mouth at bedtime. 90 tablet 3  ? Na Sulfate-K Sulfate-Mg Sulf 17.5-3.13-1.6 GM/177ML SOLN Take 1 kit by mouth once for 1 dose. 324 mL 0  ? pantoprazole (PROTONIX) 40 MG tablet Take 1 tablet (40 mg total) by mouth daily. 30 tablet 3  ? psyllium (METAMUCIL) 58.6 % powder Take 1 packet by mouth daily.    ? ?No current facility-administered medications for this visit.  ? ?No Known Allergies ? ?Review of Systems: ?All systems reviewed and negative except where noted in HPI.  ? ?Physical Exam: ?BP 120/70   Pulse 90   Ht '5\' 6"'  (1.676 m)   Wt 211 lb (95.7 kg)   BMI 34.06 kg/m?  ?Constitutional: Pleasant,well-developed, male in no acute distress. ?HEENT:  Normocephalic and atraumatic. Conjunctivae are normal. No scleral icterus. ?Cardiovascular: Normal rate, regular rhythm.  ?Pulmonary/chest: Effort normal and breath sounds normal. No wheezing, rales or rhonchi. ?Abdominal: Soft, nondistended, nontender. Bowel sounds active throughout. There are no masses palpable. No hepatomegaly. ?Extremities: No edema ?Neurological: Alert and oriented to person place and time. ?Skin: Skin is warm and dry. No rashes noted. ?Psychiatric: Normal mood and affect. Behavior is normal. ? ?Labs 08/03/21: CBC nml. CMP with mildly elevated ALT of 45. Lipase nml at 43. ? ?CT A/P w/o contrast 08/03/21: ?IMPRESSION: ?No acute process demonstrated in the abdomen or pelvis. No evidence ?of bowel obstruction or inflammation. No cause identified to account ?for abdominal  pain and rectal bleeding. ? ?ASSESSMENT AND PLAN: ?Constipation ?Rectal bleeding ?Epigastric ab pain ?GERD ?Patient presents with constipation and rectal bleeding that started in 09/2021.  He was also have been having some issues with epigastric abdominal pain.  Discussed in detail the risks and benefits of undergoing EGD and colonoscopy for further evaluation, and patient is agreeable to proceeding.  With the colonoscopy we will rule out colon cancer and IBD.  EGD will allow [place] to evaluate for causes of epigastric abdominal pain such as PUD or gastritis/duodenitis.  We will also work on his constipation issues by starting him on some MiraLAX therapy ?- Drink 8 cups of water per day ?- Walk 30 min per day ?- Continue daily Metamucil ?- Start Miralax QD. Can uptitrate up to TID to aim for one good BM per day ?- EGD/Colonoscopy [facility] ?- Patient has been taking pantoprazole PRN for GERD ? ?Christia Reading, MD ? ?

## 2022-02-03 ENCOUNTER — Encounter: Payer: Self-pay | Admitting: Internal Medicine

## 2022-02-03 ENCOUNTER — Ambulatory Visit (AMBULATORY_SURGERY_CENTER): Payer: 59 | Admitting: Internal Medicine

## 2022-02-03 VITALS — BP 115/77 | HR 65 | Temp 97.8°F | Resp 14 | Ht 66.0 in | Wt 211.0 lb

## 2022-02-03 DIAGNOSIS — R1013 Epigastric pain: Secondary | ICD-10-CM

## 2022-02-03 DIAGNOSIS — K5900 Constipation, unspecified: Secondary | ICD-10-CM

## 2022-02-03 DIAGNOSIS — K2970 Gastritis, unspecified, without bleeding: Secondary | ICD-10-CM | POA: Diagnosis not present

## 2022-02-03 DIAGNOSIS — K2100 Gastro-esophageal reflux disease with esophagitis, without bleeding: Secondary | ICD-10-CM | POA: Diagnosis not present

## 2022-02-03 DIAGNOSIS — K648 Other hemorrhoids: Secondary | ICD-10-CM

## 2022-02-03 DIAGNOSIS — K2950 Unspecified chronic gastritis without bleeding: Secondary | ICD-10-CM | POA: Diagnosis not present

## 2022-02-03 DIAGNOSIS — K219 Gastro-esophageal reflux disease without esophagitis: Secondary | ICD-10-CM

## 2022-02-03 DIAGNOSIS — K625 Hemorrhage of anus and rectum: Secondary | ICD-10-CM | POA: Diagnosis not present

## 2022-02-03 DIAGNOSIS — K5730 Diverticulosis of large intestine without perforation or abscess without bleeding: Secondary | ICD-10-CM | POA: Diagnosis not present

## 2022-02-03 MED ORDER — SODIUM CHLORIDE 0.9 % IV SOLN: 500.0000 mL | Freq: Once | INTRAVENOUS | Status: DC

## 2022-02-03 NOTE — Patient Instructions (Addendum)
Await pathology results. ? ?Follow up appointment has been scheduled on 03/05/22 @ 9:10 a.m.   ? ?Handouts on gastritis and diverticulosis given. ? ?YOU HAD AN ENDOSCOPIC PROCEDURE TODAY AT THE [facility]:   Refer to the procedure report that was given to you for any specific questions about what was found during the examination.  If the procedure report does not answer your questions, please call your gastroenterologist to clarify.  If you requested that your care partner not be given the details of your procedure findings, then the procedure report has been included in a sealed envelope for you to review at your convenience later. ? ?YOU SHOULD EXPECT: Some feelings of bloating in the abdomen. Passage of more gas than usual.  Walking can help get rid of the air that was put into your GI tract during the procedure and reduce the bloating. If you had a lower endoscopy (such as a colonoscopy or flexible sigmoidoscopy) you may notice spotting of blood in your stool or on the toilet paper. If you underwent a bowel prep for your procedure, you may not have a normal bowel movement for a few days. ? ?Please Note:  You might notice some irritation and congestion in your nose or some drainage.  This is from the oxygen used during your procedure.  There is no need for concern and it should clear up in a day or so. ? ?SYMPTOMS TO REPORT IMMEDIATELY: ? ?Following lower endoscopy (colonoscopy or flexible sigmoidoscopy): ? Excessive amounts of blood in the stool ? Significant tenderness or worsening of abdominal pains ? Swelling of the abdomen that is new, acute ? Fever of 100?F or higher ? ?Following upper endoscopy (EGD) ? Vomiting of blood or coffee ground material ? New chest pain or pain under the shoulder blades ? Painful or persistently difficult swallowing ? New shortness of breath ? Fever of 100?F or higher ? Black, tarry-looking stools ? ?For urgent or emergent issues, a gastroenterologist can be reached  at any hour by calling [phone]. ?Do not use MyChart messaging for urgent concerns.  ? ? ?DIET:  We do recommend a small meal at first, but then you may proceed to your regular diet.  Drink plenty of fluids but you should avoid alcoholic beverages for 24 hours. ? ?ACTIVITY:  You should plan to take it easy for the rest of today and you should NOT DRIVE or use heavy machinery until tomorrow (because of the sedation medicines used during the test).   ? ?FOLLOW UP: ?Our staff will call the number listed on your records 48-72 hours following your procedure to check on you and address any questions or concerns that you may have regarding the information given to you following your procedure. If we do not reach you, we will leave a message.  We will attempt to reach you two times.  During this call, we will ask if you have developed any symptoms of COVID 19. If you develop any symptoms (ie: fever, flu-like symptoms, shortness of breath, cough etc.) before then, please call [phone].  If you test positive for Covid 19 in the 2 weeks post procedure, please call and report this information to [place].   ? ?If any biopsies were taken you will be contacted by phone or by letter within the next 1-3 weeks.  Please call us at [phone] if you have not heard about the biopsies in 3 weeks.  ? ? ?SIGNATURES/CONFIDENTIALITY: ?You and/or your care partner have signed  paperwork which will be entered into your electronic medical record.  These signatures attest to the fact that that the information above on your After Visit Summary has been reviewed and is understood.  Full responsibility of the confidentiality of this discharge information lies with you and/or your care-partner.  ?

## 2022-02-03 NOTE — Op Note (Signed)
[facility] ?Patient Name: Peter RungHassan Altman ?Procedure Date: 02/03/2022 12:23 PM ?MRN: [id number] ?Endoscopist: Nicole KindredYing "Eulah Pontlaire" Aeson Sawyers ,  ?Age: 31 ?Referring MD:  ?Date of Birth: 1991-06-12 ?Gender: Male ?Account #: [id number] ?Procedure:                Colonoscopy ?Indications:              Rectal bleeding, Constipation ?Medicines:                Monitored Anesthesia Care ?Procedure:                Pre-Anesthesia Assessment: ?                          - Prior to the procedure, a History and Physical  ?                          was performed, and patient medications and  ?                          allergies were reviewed. The patient's tolerance of  ?                          previous anesthesia was also reviewed. The risks  ?                          and benefits of the procedure and the sedation  ?                          options and risks were discussed with the patient.  ?                          All questions were answered, and informed consent  ?                          was obtained. Prior Anticoagulants: The patient has  ?                          taken no previous anticoagulant or antiplatelet  ?                          agents. ASA Grade Assessment: II - A patient with  ?                          mild systemic disease. After reviewing the risks  ?                          and benefits, the patient was deemed in  ?                          satisfactory condition to undergo the procedure. ?                          After obtaining informed consent, the colonoscope  ?  was passed under direct vision. Throughout the  ?                          procedure, the patient's blood pressure, pulse, and  ?                          oxygen saturations were monitored continuously. The  ?                          Olympus CF-[id number] (#[id number]) Colonoscope was  ?                          introduced through the anus and advanced to the the  ?                          terminal ileum. The  colonoscopy was performed  ?                          without difficulty. The patient tolerated the  ?                          procedure well. The quality of the bowel  ?                          preparation was good. The terminal ileum, ileocecal  ?                          valve, appendiceal orifice, and rectum were  ?                          photographed. ?Scope In: 1:45:57 PM ?Scope Out: 2:01:56 PM ?Scope Withdrawal Time: 0 hours 13 minutes 45 seconds  ?Total Procedure Duration: 0 hours 15 minutes 59 seconds  ?Findings:                 A localized area of mucosa in the terminal ileum  ?                          was nodular. Biopsies were taken with a cold  ?                          forceps for histology. ?                          Multiple diverticula were found in the sigmoid  ?                          colon and cecum. ?                          A localized area of erythematous mucosa was found  ?                          in the sigmoid colon and in the ascending colon.  ?  This was biopsied with a cold forceps for histology. ?                          Localized inflammation characterized by altered  ?                          vascularity and erythema was found in the rectum.  ?                          Biopsies were taken with a cold forceps for  ?                          histology. ?                          Non-bleeding internal hemorrhoids were found during  ?                          retroflexion. ?Complications:            No immediate complications. ?Estimated Blood Loss:     Estimated blood loss was minimal. ?Impression:               - Nodular ileal mucosa. Biopsied. ?                          - Diverticulosis in the sigmoid colon and in the  ?                          cecum. ?                          - Erythematous mucosa in the sigmoid colon and in  ?                          the ascending colon. Biopsied. ?                          - Localized inflammation was found in  the rectum.  ?                          Biopsied. ?                          - Non-bleeding internal hemorrhoids. ?Recommendation:           - Discharge patient to home (with escort). ?                          - Await pathology results. ?                          - Return to GI clinic in 1 month. ?                          - The findings and recommendations were discussed  ?  with the patient. ?Particia Lather,  ?02/03/2022 2:12:11 PM ?

## 2022-02-03 NOTE — Progress Notes (Signed)
Pt non-responsive, VVS, Report to RN  °

## 2022-02-03 NOTE — Op Note (Signed)
[facility] ?Patient Name: Peter Key ?Procedure Date: 02/03/2022 12:30 PM ?MRN: [id number] ?Endoscopist: Nicole Kindred "Eulah Pont ,  ?Age: 31 ?Referring MD:  ?Date of Birth: 10-08-1991 ?Gender: Male ?Account #: [id number] ?Procedure:                Upper GI endoscopy ?Indications:              Epigastric abdominal pain ?Medicines:                Monitored Anesthesia Care ?Procedure:                Pre-Anesthesia Assessment: ?                          - Prior to the procedure, a History and Physical  ?                          was performed, and patient medications and  ?                          allergies were reviewed. The patient's tolerance of  ?                          previous anesthesia was also reviewed. The risks  ?                          and benefits of the procedure and the sedation  ?                          options and risks were discussed with the patient.  ?                          All questions were answered, and informed consent  ?                          was obtained. Prior Anticoagulants: The patient has  ?                          taken no previous anticoagulant or antiplatelet  ?                          agents. ASA Grade Assessment: II - A patient with  ?                          mild systemic disease. After reviewing the risks  ?                          and benefits, the patient was deemed in  ?                          satisfactory condition to undergo the procedure. ?                          After obtaining informed consent, the endoscope was  ?  passed under direct vision. Throughout the  ?                          procedure, the patient's blood pressure, pulse, and  ?                          oxygen saturations were monitored continuously. The  ?                          GIF HQ190 #[id number] was introduced through the  ?                          mouth, and advanced to the second part of duodenum.  ?                          The upper GI endoscopy was  accomplished without  ?                          difficulty. The patient tolerated the procedure  ?                          well. ?Scope In: ?Scope Out: ?Findings:                 The examined esophagus was normal. Biopsies were  ?                          taken with a cold forceps for histology. ?                          Localized erythematous mucosa without bleeding was  ?                          found in the gastric antrum. Biopsies were taken  ?                          with a cold forceps for histology. ?                          The examined duodenum was normal. Biopsies were  ?                          taken with a cold forceps for histology. ?Complications:            No immediate complications. ?Estimated Blood Loss:     Estimated blood loss was minimal. ?Impression:               - Normal esophagus. Biopsied. ?                          - Erythematous mucosa in the antrum. Biopsied. ?                          - Normal examined duodenum. Biopsied. ?Recommendation:           - Await pathology results. ?                          -  Perform a colonoscopy today. ?Peter Key "Peter Key" Peter Key,  ?02/03/2022 2:07:28 PM ?

## 2022-02-03 NOTE — Progress Notes (Signed)
? ?GASTROENTEROLOGY PROCEDURE H&P NOTE  ? ?Primary Care Physician: ?Nestor Ramp, MD ? ? ? ?Reason for Procedure:   Rectal bleeding, epigastric abdominal pain ? ?Plan:    EGD/colonoscopy ? ?Patient is appropriate for endoscopic procedure(s) in the ambulatory ([facility]) setting. ? ?The nature of the procedure, as well as the risks, benefits, and alternatives were carefully and thoroughly reviewed with the patient. Ample time for discussion and questions allowed. The patient understood, was satisfied, and agreed to proceed.  ? ? ? ?HPI: ?Peter Key is a 31 y.o. male who presents for EGD/colonoscopy for evaluation of rectal bleeding and epigastric ab pain .  Patient was most recently seen in the Gastroenterology Clinic on 12/23/21.  No interval change in medical history since that appointment. Please refer to that note for full details regarding GI history and clinical presentation.  ? ?Past Medical History:  ?Diagnosis Date  ? Anxiety   ? Asthma   ? Sleep apnea   ? uses a C-pap  ? ? ?Past Surgical History:  ?Procedure Laterality Date  ? TONSILLECTOMY    ? ? ?Prior to Admission medications   ?Medication Sig Start Date End Date Taking? Authorizing Provider  ?candesartan (ATACAND) 16 MG tablet Take 1 tablet (16 mg total) by mouth at bedtime. 01/07/21   Nestor Ramp, MD  ?pantoprazole (PROTONIX) 40 MG tablet Take 1 tablet (40 mg total) by mouth daily. 06/08/21   Nestor Ramp, MD  ?psyllium (METAMUCIL) 58.6 % powder Take 1 packet by mouth daily.    [provider]  ? ? ?Current Outpatient Medications  ?Medication Sig Dispense Refill  ? candesartan (ATACAND) 16 MG tablet Take 1 tablet (16 mg total) by mouth at bedtime. 90 tablet 3  ? pantoprazole (PROTONIX) 40 MG tablet Take 1 tablet (40 mg total) by mouth daily. 30 tablet 3  ? psyllium (METAMUCIL) 58.6 % powder Take 1 packet by mouth daily.    ? ?No current facility-administered medications for this visit.  ? ? ?Allergies as of 02/03/2022  ? (No Known Allergies)   ? ? ?Family History  ?Problem Relation Age of Onset  ? Hypertension Maternal Grandmother   ? Diabetes Maternal Grandmother   ? Hypertension Maternal Grandfather   ? Colon cancer Neg Hx   ? Esophageal cancer Neg Hx   ? Rectal cancer Neg Hx   ? ? ?Social History  ? ?Socioeconomic History  ? Marital status: Single  ?  Spouse name: Not on file  ? Number of children: 0  ? Years of education: Not on file  ? Highest education level: Not on file  ?Occupational History  ? Occupation: Arts administrator at [employer]  ?Tobacco Use  ? Smoking status: Former  ?  Packs/day: 0.25  ?  Types: Cigarettes  ?  Quit date: 10/18/2020  ?  Years since quitting: 1.2  ? Smokeless tobacco: Never  ?Vaping Use  ? Vaping Use: Never used  ?Substance and Sexual Activity  ? Alcohol use: Not Currently  ? Drug use: No  ? Sexual activity: Not on file  ?Other Topics Concern  ? Not on file  ?Social History Narrative  ? ** Merged History Encounter **  ?    ? ?Social Determinants of Health  ? ?Financial Resource Strain: Not on file  ?Food Insecurity: Not on file  ?Transportation Needs: Not on file  ?Physical Activity: Not on file  ?Stress: Not on file  ?Social Connections: Not on file  ?Intimate Partner Violence:  Not on file  ? ? ?Physical Exam: ?Vital signs in last 24 hours: ?There were no vitals taken for this visit. ?GEN: NAD ?EYE: Sclerae anicteric ?ENT: MMM ?CV: Non-tachycardic ?Pulm: No increased WOB ?GI: Soft ?NEURO:  Alert & Oriented ? ? ?Eulah Pont, MD ?[facility] ? ? ?02/03/2022 12:32 PM ? ?

## 2022-02-05 ENCOUNTER — Telehealth: Payer: Self-pay | Admitting: *Deleted

## 2022-02-05 NOTE — Telephone Encounter (Signed)
?  Follow up Call- ? ? ?  02/03/2022  ? 12:44 PM  ?Call back number  ?Post procedure Call Back phone  # [phone]  ?Permission to leave phone message Yes  ?  ? ?Patient questions: ? ?Do you have a fever, pain , or abdominal swelling? No. ?Pain Score  0 * ? ?Have you tolerated food without any problems? Yes.   ? ?Have you been able to return to your normal activities? Yes.   ? ?Do you have any questions about your discharge instructions: ?Diet   No. ?Medications  No. ?Follow up visit  No. ? ?Do you have questions or concerns about your Care? No. ? ?Actions: ?* If pain score is 4 or above: ?No action needed, pain <4. ? ? ?

## 2022-02-08 ENCOUNTER — Encounter: Payer: Self-pay | Admitting: Internal Medicine

## 2022-03-05 ENCOUNTER — Ambulatory Visit: Payer: 59 | Admitting: Internal Medicine

## 2022-03-05 ENCOUNTER — Telehealth: Payer: Self-pay

## 2022-03-05 ENCOUNTER — Encounter: Payer: Self-pay | Admitting: Internal Medicine

## 2022-03-05 VITALS — BP 108/70 | HR 74 | Ht 66.0 in | Wt 209.2 lb

## 2022-03-05 DIAGNOSIS — K219 Gastro-esophageal reflux disease without esophagitis: Secondary | ICD-10-CM | POA: Diagnosis not present

## 2022-03-05 DIAGNOSIS — K2970 Gastritis, unspecified, without bleeding: Secondary | ICD-10-CM | POA: Diagnosis not present

## 2022-03-05 DIAGNOSIS — K2990 Gastroduodenitis, unspecified, without bleeding: Secondary | ICD-10-CM | POA: Diagnosis not present

## 2022-03-05 DIAGNOSIS — K5900 Constipation, unspecified: Secondary | ICD-10-CM

## 2022-03-05 MED ORDER — LINACLOTIDE 72 MCG PO CAPS: 72.0000 ug | ORAL_CAPSULE | Freq: Every day | ORAL | 3 refills | Status: DC

## 2022-03-05 MED ORDER — PANTOPRAZOLE SODIUM 20 MG PO TBEC: 20.0000 mg | DELAYED_RELEASE_TABLET | Freq: Every day | ORAL | 6 refills | Status: AC

## 2022-03-05 NOTE — Telephone Encounter (Signed)
Tried to start prior authorization for patient's Linzess. Unable to get it started due to the patient and his date of birth no matching for insurance on Cover My Meds. Sent message to pharmacy. ?

## 2022-03-05 NOTE — Patient Instructions (Addendum)
If you are age 32 or older, your body mass index should be between 23-30. Your Body mass index is 33.77 kg/m?Marland Kitchen If this is out of the aforementioned range listed, please consider follow up with your Primary Care Provider. ? ?If you are age 66 or younger, your body mass index should be between 19-25. Your Body mass index is 33.77 kg/m?Marland Kitchen If this is out of the aformentioned range listed, please consider follow up with your Primary Care Provider.  ? ?________________________________________________________ ? ?The [facility] providers would like to encourage you to use [facility] to communicate with providers for non-urgent requests or questions.  Due to long hold times on the telephone, sending your provider a message by [facility] may be a faster and more efficient way to get a response.  Please allow 48 business hours for a response.  Please remember that this is for non-urgent requests.  ?_______________________________________________________ ? ?Try to eat 25 grams of fiber daily. ? ?Decrease to Pantoprazole 20 mg 1 tablet every morning ? ?START Linzess 72 mcg 1 capsule every morning before a meal. ? ?Try to follow anti-reflux diet. ? ?Follow up in 3 months. ? ?Thank you for entrusting me with your care and choosing [facility]. ? ?Dr Leonides Schanz ?

## 2022-03-05 NOTE — Progress Notes (Signed)
? ?Chief Complaint: Constipation, GERD ? ?HPI : 31 year old male with history of OSA, anxiety, GERD, and childhood asthma presents for follow up of GERD and constipation. ? ?Interval History: He has been doing well since his EGD and colonoscopy procedures. His constipation has improved, but he is still having on average one BM every other day. He has tried to increase his hydration. Has not been consistent about taking a daily fiber supplement because he saw that Metamucil has aspartame in it. He also tried a few doses of Miralax, which helped somewhat. He has been continuing to take Protonix 40 mg as needed for GERD symptoms. He does feel like the Protonix sometimes makes him feel like food does not move as well through his GI tract. He is no longer having any issues with rectal bleeding or abdominal pain. ? ?Wt Readings from Last 3 Encounters:  ?03/05/22 209 lb 4 oz (94.9 kg)  ?02/03/22 211 lb (95.7 kg)  ?12/23/21 211 lb (95.7 kg)  ? ?Current Outpatient Medications  ?Medication Sig Dispense Refill  ? candesartan (ATACAND) 16 MG tablet Take 1 tablet (16 mg total) by mouth at bedtime. 90 tablet 3  ? linaclotide (LINZESS) 72 MCG capsule Take 1 capsule (72 mcg total) by mouth daily before breakfast. 30 capsule 3  ? psyllium (METAMUCIL) 58.6 % powder Take 1 packet by mouth daily.    ? pantoprazole (PROTONIX) 20 MG tablet Take 1 tablet (20 mg total) by mouth daily. 30 tablet 6  ? ?No current facility-administered medications for this visit.  ? ?Review of Systems: ?All systems reviewed and negative except where noted in HPI.  ? ?Physical Exam: ?BP 108/70   Pulse 74   Ht 5\' 6"  (1.676 m)   Wt 209 lb 4 oz (94.9 kg)   BMI 33.77 kg/m?  ?Constitutional: Pleasant,well-developed, male in no acute distress. ?HEENT: Normocephalic and atraumatic. Conjunctivae are normal. No scleral icterus. ?Cardiovascular: Normal rate, regular rhythm.  ?Pulmonary/chest: Effort normal and breath sounds normal. No wheezing, rales or  rhonchi. ?Abdominal: Soft, nondistended, nontender. Bowel sounds active throughout. There are no masses palpable. No hepatomegaly. ?Extremities: No edema ?Neurological: Alert and oriented to person place and time. ?Skin: Skin is warm and dry. No rashes noted. ?Psychiatric: Normal mood and affect. Behavior is normal. ? ?Labs 08/03/21: CBC nml. CMP with mildly elevated ALT of 45. Lipase nml at 43. ? ?CT A/P w/o contrast 08/03/21: ?IMPRESSION: ?No acute process demonstrated in the abdomen or pelvis. No evidence ?of bowel obstruction or inflammation. No cause identified to account ?for abdominal pain and rectal bleeding. ? ?EGD 02/03/22: ?- Normal esophagus. Biopsied. ?- Erythematous mucosa in the antrum. Biopsied. ?- Normal examined duodenum. Biopsied ?Path: ?1. Surgical [P], duodenal ?BENIGN DUODENAL MUCOSA WITH NO DIAGNOSTIC ABNORMALITY ?2. 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 PYLORI, INTESTINAL METAPLASIA, DYSPLASIA AND CARCINOMA ?3. Surgical [P], esophageal ?REFLUX ESOPHAGITIS (9 EOS/HPF) ?NEGATIVE FOR GLANDULAR EPITHELIUM, DYSPLASIA AND CARCINOMA ? ?Colonoscopy 02/03/22: ?- Nodular ileal mucosa. Biopsied. ?- Diverticulosis in the sigmoid colon and in the cecum. ?- Erythematous mucosa in the sigmoid colon and in the ascending colon. Biopsied. ?- Localized inflammation was found in the rectum. Biopsied. ?Path: ?4. Surgical [P], ileum biopsy's ?BENIGN ILEAL MUCOSA WITH NO DIAGNOSTIC ABNORMALITY ?5. Surgical [P], right colon ?BENIGN COLONIC MUCOSA WITH NO DIAGNOSTIC ABNORMALITY ?6. Surgical [P], colon, transverse ?BENIGN COLONIC MUCOSA WITH NO DIAGNOSTIC ABNORMALITY ?7. Surgical [P], left colon ?BENIGN COLONIC MUCOSA WITH NO DIAGNOSTIC ABNORMALITY ?  8. Surgical [P], colon, rectal ?BENIGN COLONIC MUCOSA WITH NO DIAGNOSTIC ABNORMALITY ? ?ASSESSMENT AND PLAN: ?Constipation ?GERD ?Gastritis ?Patient presents for follow up of GERD and constipation.  He would be interested in some additional options for treating his constipation so I will start him on Linzess. Also encouraged him to increase his fiber consumption, which will help him have a BM more regularly. He was found to have reflux esophagitis and gastritis on his last EGD. Will plan to give him a GERD handout on way to try to reduce his symptoms and then will decrease his Protonix from 40 mg to 20 mg to see if this is better tolerated by the patient.  ?- GERD handout ?- Aim to eat 25 grams of fiber per day ?- Continue to drink 8 cups of water per day ?- Decrease Protonix from 40 mg to 20 mg QD ?- Start Linzess 72 mcg QD ?- RTC 3 months ? ?Eulah Pont, MD ? ?

## 2022-03-09 ENCOUNTER — Telehealth: Payer: Self-pay | Admitting: Pharmacy Technician

## 2022-03-09 ENCOUNTER — Other Ambulatory Visit (HOSPITAL_COMMUNITY): Payer: Self-pay

## 2022-03-09 NOTE — Telephone Encounter (Signed)
Patient Advocate Encounter ? ?Received notification from [employer] that prior authorization for LINZESS is required. ?  ?PA submitted on 5.16.23 ?Key [id number] ?Status is pending ?  ?[facility] will continue to follow ? ?Peter Key R Claudia Alvizo, CPhT ?Patient Advocate ?Phone: [phone] ? ?

## 2022-03-10 ENCOUNTER — Other Ambulatory Visit (HOSPITAL_COMMUNITY): Payer: Self-pay

## 2022-03-10 NOTE — Telephone Encounter (Signed)
Received notification from [employer] regarding a prior authorization for LINZESS . Authorization has been APPROVED from 5.16.23 to 5.16.24.  ? ?Per test claim, copay for 30 days supply is $30 ? ? ?Authorization # [id number] ? ? ?

## 2022-03-30 ENCOUNTER — Encounter: Payer: Self-pay | Admitting: *Deleted

## 2022-05-04 ENCOUNTER — Other Ambulatory Visit: Payer: Self-pay

## 2022-05-05 MED ORDER — CANDESARTAN CILEXETIL 16 MG PO TABS: 16.0000 mg | ORAL_TABLET | Freq: Every day | ORAL | 3 refills | Status: DC

## 2022-05-17 ENCOUNTER — Encounter: Payer: Self-pay | Admitting: Internal Medicine

## 2022-09-10 ENCOUNTER — Encounter (HOSPITAL_BASED_OUTPATIENT_CLINIC_OR_DEPARTMENT_OTHER): Payer: Self-pay | Admitting: Emergency Medicine

## 2022-09-10 ENCOUNTER — Other Ambulatory Visit: Payer: Self-pay

## 2022-09-10 ENCOUNTER — Emergency Department (HOSPITAL_BASED_OUTPATIENT_CLINIC_OR_DEPARTMENT_OTHER): Payer: 59

## 2022-09-10 ENCOUNTER — Emergency Department (HOSPITAL_BASED_OUTPATIENT_CLINIC_OR_DEPARTMENT_OTHER)
Admission: EM | Admit: 2022-09-10 | Discharge: 2022-09-10 | Discharge status: Against Medical Advice | Payer: 59 | Attending: Emergency Medicine | Admitting: Emergency Medicine

## 2022-09-10 DIAGNOSIS — I10 Essential (primary) hypertension: Secondary | ICD-10-CM | POA: Diagnosis present

## 2022-09-10 DIAGNOSIS — R42 Dizziness and giddiness: Secondary | ICD-10-CM | POA: Insufficient documentation

## 2022-09-10 DIAGNOSIS — R200 Anesthesia of skin: Secondary | ICD-10-CM | POA: Insufficient documentation

## 2022-09-10 DIAGNOSIS — Z5321 Procedure and treatment not carried out due to patient leaving prior to being seen by health care provider: Secondary | ICD-10-CM | POA: Insufficient documentation

## 2022-09-10 LAB — BASIC METABOLIC PANEL
Anion gap: 7 (ref 5–15)
BUN: 12 mg/dL (ref 6–20)
CO2: 27 mmol/L (ref 22–32)
Calcium: 9 mg/dL (ref 8.9–10.3)
Chloride: 103 mmol/L (ref 98–111)
Creatinine, Ser: 1.06 mg/dL (ref 0.61–1.24)
GFR, Estimated: >60 mL/min (ref >60)
Glucose, Bld: 91 mg/dL (ref 70–99)
Potassium: 3.7 mmol/L (ref 3.5–5.1)
Sodium: 137 mmol/L (ref 135–145)

## 2022-09-10 LAB — CBC
HCT: 44.7 % (ref 39.0–52.0)
Hemoglobin: 15.2 g/dL (ref 13.0–17.0)
MCH: 29 pg (ref 26.0–34.0)
MCHC: 34 g/dL (ref 30.0–36.0)
MCV: 85.3 fL (ref 80.0–100.0)
Platelets: 281 10*3/uL (ref 150–400)
RBC: 5.24 MIL/uL (ref 4.22–5.81)
RDW: 13.4 % (ref 11.5–15.5)
WBC: 7.4 10*3/uL (ref 4.0–10.5)
nRBC: 0 % (ref 0.0–0.2)

## 2022-09-10 LAB — TROPONIN I (HIGH SENSITIVITY): Troponin I (High Sensitivity): 4 ng/L (ref <18)

## 2022-09-10 NOTE — ED Triage Notes (Signed)
Hypertension x 2 weeks , no Hx of it . Chest sharp pain last night woke him up from sleep . Dizziness. Lightheaded , numbness to bilateral legs x 2 weeks .

## 2022-12-04 ENCOUNTER — Emergency Department (HOSPITAL_BASED_OUTPATIENT_CLINIC_OR_DEPARTMENT_OTHER): Payer: 59

## 2022-12-04 ENCOUNTER — Emergency Department (HOSPITAL_BASED_OUTPATIENT_CLINIC_OR_DEPARTMENT_OTHER)
Admission: EM | Admit: 2022-12-04 | Discharge: 2022-12-05 | Disposition: A | Discharge status: Home / Self Care | Payer: 59 | Attending: Emergency Medicine | Admitting: Emergency Medicine

## 2022-12-04 ENCOUNTER — Other Ambulatory Visit: Payer: Self-pay

## 2022-12-04 ENCOUNTER — Encounter (HOSPITAL_BASED_OUTPATIENT_CLINIC_OR_DEPARTMENT_OTHER): Payer: Self-pay

## 2022-12-04 DIAGNOSIS — K1120 Sialoadenitis, unspecified: Secondary | ICD-10-CM | POA: Insufficient documentation

## 2022-12-04 DIAGNOSIS — Z1152 Encounter for screening for COVID-19: Secondary | ICD-10-CM | POA: Diagnosis not present

## 2022-12-04 DIAGNOSIS — J029 Acute pharyngitis, unspecified: Secondary | ICD-10-CM | POA: Diagnosis present

## 2022-12-04 LAB — BASIC METABOLIC PANEL
Anion gap: 5 (ref 5–15)
BUN: 13 mg/dL (ref 6–20)
CO2: 27 mmol/L (ref 22–32)
Calcium: 8.7 mg/dL — ABNORMAL LOW (ref 8.9–10.3)
Chloride: 104 mmol/L (ref 98–111)
Creatinine, Ser: 1.16 mg/dL (ref 0.61–1.24)
GFR, Estimated: >60 mL/min (ref >60)
Glucose, Bld: 117 mg/dL — ABNORMAL HIGH (ref 70–99)
Potassium: 3.3 mmol/L — ABNORMAL LOW (ref 3.5–5.1)
Sodium: 136 mmol/L (ref 135–145)

## 2022-12-04 LAB — CBC
HCT: 41.2 % (ref 39.0–52.0)
Hemoglobin: 14.3 g/dL (ref 13.0–17.0)
MCH: 29.1 pg (ref 26.0–34.0)
MCHC: 34.7 g/dL (ref 30.0–36.0)
MCV: 83.9 fL (ref 80.0–100.0)
Platelets: 270 10*3/uL (ref 150–400)
RBC: 4.91 MIL/uL (ref 4.22–5.81)
RDW: 13.7 % (ref 11.5–15.5)
WBC: 8.5 10*3/uL (ref 4.0–10.5)
nRBC: 0 % (ref 0.0–0.2)

## 2022-12-04 LAB — RESP PANEL BY RT-PCR (RSV, FLU A&B, COVID)  RVPGX2
Influenza A by PCR: NEGATIVE
Influenza B by PCR: NEGATIVE
Resp Syncytial Virus by PCR: NEGATIVE
SARS Coronavirus 2 by RT PCR: NEGATIVE

## 2022-12-04 LAB — GROUP A STREP BY PCR: Group A Strep by PCR: NOT DETECTED

## 2022-12-04 MED ORDER — IOHEXOL 300 MG/ML  SOLN
75.0000 mL | Freq: Once | INTRAMUSCULAR | Status: AC | PRN
  Administered 2022-12-04: 75 mL via INTRAVENOUS

## 2022-12-04 MED ORDER — KETOROLAC TROMETHAMINE 15 MG/ML IJ SOLN
15.0000 mg | Freq: Once | INTRAMUSCULAR | Status: AC
  Administered 2022-12-04: 15 mg via INTRAVENOUS
  Filled 2022-12-04: qty 1

## 2022-12-04 NOTE — ED Provider Notes (Incomplete)
[place] [facility] Provider Note   CSN: [id number] Arrival date & time: 12/04/22  2111     History {Add pertinent medical, surgical, social history, OB history to HPI:1} Chief Complaint  Patient presents with  . Sore Throat    Peter Key is a 32 y.o. male.   Sore Throat       Home Medications Prior to Admission medications   Medication Sig Start Date End Date Taking? Authorizing Provider  candesartan (ATACAND) 16 MG tablet Take 1 tablet (16 mg total) by mouth at bedtime. 05/05/22   Dickie La, MD  linaclotide Rolan Lipa) 72 MCG capsule Take 1 capsule (72 mcg total) by mouth daily before breakfast. 03/05/22   Sharyn Creamer, MD  pantoprazole (PROTONIX) 20 MG tablet Take 1 tablet (20 mg total) by mouth daily. 03/05/22   Sharyn Creamer, MD  psyllium (METAMUCIL) 58.6 % powder Take 1 packet by mouth daily.    [provider]      Allergies    Patient has no known allergies.    Review of Systems   Review of Systems  Physical Exam Updated Vital Signs BP (!) 135/96 (BP Location: Right Arm)   Pulse 93   Temp 98.5 F (36.9 C) (Oral)   Resp 18   SpO2 98%  Physical Exam  ED Results / Procedures / Treatments   Labs (all labs ordered are listed, but only abnormal results are displayed) Labs Reviewed  BASIC METABOLIC PANEL - Abnormal; Notable for the following components:      Result Value   Potassium 3.3 (*)    Glucose, Bld 117 (*)    Calcium 8.7 (*)    All other components within normal limits  GROUP A STREP BY PCR  RESP PANEL BY RT-PCR (RSV, FLU A&B, COVID)  RVPGX2  CBC    EKG None  Radiology CT Soft Tissue Neck W Contrast  Result Date: 12/04/2022 CLINICAL DATA:  Initial evaluation for right-sided neck swelling, infection suspected. EXAM: CT NECK WITH CONTRAST TECHNIQUE: Multidetector CT imaging of the neck was performed using the standard protocol following the bolus administration of intravenous contrast.  RADIATION DOSE REDUCTION: This exam was performed according to the departmental dose-optimization program which includes automated exposure control, adjustment of the mA and/or kV according to patient size and/or use of iterative reconstruction technique. CONTRAST:  40m OMNIPAQUE IOHEXOL 300 MG/ML  SOLN COMPARISON:  None Available. FINDINGS: Pharynx and larynx: Oral cavity within normal limits. No acute inflammatory changes seen about the dentition. Palatine tonsils symmetric and within normal limits. Remainder of the oropharynx and nasopharynx within normal limits. No retropharyngeal collection or swelling. Negative epiglottis. Hypopharynx and supraglottic larynx within normal limits. Negative glottis. Subglottic airway patent clear. Salivary glands: Parotid glands within normal limits. Left submandibular gland normal. There is subtle asymmetric enlargement and heterogeneity of the right submandibular gland, which could reflect changes of early and/or developing acute sialoadenitis (series 3, image 53). Subtle induration within the adjacent fat of the right submandibular space. No obstructive stone identified. No discrete abscess or drainable fluid collection. Thyroid: Normal. Lymph nodes: No enlarged or pathologic adenopathy seen within the neck. Vascular: Normal intravascular enhancement seen throughout the neck. Limited intracranial: Unremarkable. Visualized orbits: Unremarkable. Mastoids and visualized paranasal sinuses: Visualized paranasal sinuses are clear. Visualized mastoids and middle ear cavities are well pneumatized and free of fluid. Skeleton: No discrete or worrisome osseous lesions. Upper chest: Visualized upper chest demonstrates no acute finding. Other: None.  IMPRESSION: 1. Subtle asymmetric heterogeneity of the right submandibular gland, which could reflect changes of early and/or developing acute sialoadenitis. Correlation with physical exam recommended. No obstructive stone identified. No  discrete abscess or drainable fluid collection. 2. No other acute abnormality within the neck. Electronically Signed   By: Jeannine Boga M.D.   On: 12/04/2022 23:41    Procedures Procedures  {Document cardiac monitor, telemetry assessment procedure when appropriate:1}  Medications Ordered in ED Medications  iohexol (OMNIPAQUE) 300 MG/ML solution 75 mL (75 mLs Intravenous Contrast Given 12/04/22 2329)    ED Course/ Medical Decision Making/ A&P Clinical Course as of 12/04/22 2352  Sat Dec 04, 2022  2305 BMP, CBC unremarkable COVID influenza strep all negative.  CT soft tissue neck with contrast ordered. [WF]    Clinical Course User Index [WF] Tedd Sias, PA   {   Click here for ABCD2, HEART and other calculatorsREFRESH Note before signing :1}                          Medical Decision Making Amount and/or Complexity of Data Reviewed Labs: ordered. Radiology: ordered.  Risk Prescription drug management.   ***  {Document critical care time when appropriate:1} {Document review of labs and clinical decision tools ie heart score, Chads2Vasc2 etc:1}  {Document your independent review of radiology images, and any outside records:1} {Document your discussion with family members, caretakers, and with consultants:1} {Document social determinants of health affecting pt's care:1} {Document your decision making why or why not admission, treatments were needed:1} Final Clinical Impression(s) / ED Diagnoses Final diagnoses:  None    Rx / DC Orders ED Discharge Orders     None

## 2022-12-04 NOTE — ED Triage Notes (Signed)
Pt with sore throat worse with swallowing and left sided swelling to his throat.  This began today. No fevers chills, n/v/d or shob

## 2022-12-04 NOTE — ED Provider Notes (Signed)
[place] [facility] Provider Note   CSN: [id number] Arrival date & time: 12/04/22  2111     History  Chief Complaint  Patient presents with   Sore Throat    Peter Key is a 32 y.o. male.   Sore Throat  Patient is a 32 year old male present emergency room today with complaints of approximately 1 week of sore throat on the right side of his neck just underneath the jaw no fevers nausea vomiting or diarrhea.  No difficulty breathing some discomfort with swallowing.  No changes in phonation.  No dental pain.  He denies any trismus.  He states that today he noticed some swelling under his right jaw.     Home Medications Prior to Admission medications   Medication Sig Start Date End Date Taking? Authorizing Provider  candesartan (ATACAND) 16 MG tablet Take 1 tablet (16 mg total) by mouth at bedtime. 05/05/22   Dickie La, MD  linaclotide Rolan Lipa) 72 MCG capsule Take 1 capsule (72 mcg total) by mouth daily before breakfast. 03/05/22   Sharyn Creamer, MD  pantoprazole (PROTONIX) 20 MG tablet Take 1 tablet (20 mg total) by mouth daily. 03/05/22   Sharyn Creamer, MD  psyllium (METAMUCIL) 58.6 % powder Take 1 packet by mouth daily.    [provider]      Allergies    Patient has no known allergies.    Review of Systems   Review of Systems  Physical Exam Updated Vital Signs BP (!) 130/90 (BP Location: Left Arm)   Pulse 87   Temp 98.5 F (36.9 C) (Oral)   Resp 18   SpO2 98%  Physical Exam Vitals and nursing note reviewed.  Constitutional:      General: He is not in acute distress. HENT:     Head: Normocephalic and atraumatic.     Nose: Nose normal.  Eyes:     General: No scleral icterus. Neck:     Comments: There is area of swelling underneath his right jaw Cardiovascular:     Rate and Rhythm: Normal rate and regular rhythm.     Pulses: Normal pulses.     Heart sounds: Normal heart sounds.  Pulmonary:     Effort:  Pulmonary effort is normal.     Breath sounds: Normal breath sounds. No wheezing.  Abdominal:     Tenderness: There is no abdominal tenderness.  Musculoskeletal:     Cervical back: Normal range of motion.     Right lower leg: No edema.     Left lower leg: No edema.  Skin:    General: Skin is warm and dry.     Capillary Refill: Capillary refill takes less than 2 seconds.  Neurological:     Mental Status: He is alert. Mental status is at baseline.  Psychiatric:        Mood and Affect: Mood normal.        Behavior: Behavior normal.     ED Results / Procedures / Treatments   Labs (all labs ordered are listed, but only abnormal results are displayed) Labs Reviewed  BASIC METABOLIC PANEL - Abnormal; Notable for the following components:      Result Value   Potassium 3.3 (*)    Glucose, Bld 117 (*)    Calcium 8.7 (*)    All other components within normal limits  GROUP A STREP BY PCR  RESP PANEL BY RT-PCR (RSV, FLU A&B, COVID)  RVPGX2  CBC  EKG None  Radiology CT Soft Tissue Neck W Contrast  Result Date: 12/04/2022 CLINICAL DATA:  Initial evaluation for right-sided neck swelling, infection suspected. EXAM: CT NECK WITH CONTRAST TECHNIQUE: Multidetector CT imaging of the neck was performed using the standard protocol following the bolus administration of intravenous contrast. RADIATION DOSE REDUCTION: This exam was performed according to the departmental dose-optimization program which includes automated exposure control, adjustment of the mA and/or kV according to patient size and/or use of iterative reconstruction technique. CONTRAST:  39m OMNIPAQUE IOHEXOL 300 MG/ML  SOLN COMPARISON:  None Available. FINDINGS: Pharynx and larynx: Oral cavity within normal limits. No acute inflammatory changes seen about the dentition. Palatine tonsils symmetric and within normal limits. Remainder of the oropharynx and nasopharynx within normal limits. No retropharyngeal collection or swelling.  Negative epiglottis. Hypopharynx and supraglottic larynx within normal limits. Negative glottis. Subglottic airway patent clear. Salivary glands: Parotid glands within normal limits. Left submandibular gland normal. There is subtle asymmetric enlargement and heterogeneity of the right submandibular gland, which could reflect changes of early and/or developing acute sialoadenitis (series 3, image 53). Subtle induration within the adjacent fat of the right submandibular space. No obstructive stone identified. No discrete abscess or drainable fluid collection. Thyroid: Normal. Lymph nodes: No enlarged or pathologic adenopathy seen within the neck. Vascular: Normal intravascular enhancement seen throughout the neck. Limited intracranial: Unremarkable. Visualized orbits: Unremarkable. Mastoids and visualized paranasal sinuses: Visualized paranasal sinuses are clear. Visualized mastoids and middle ear cavities are well pneumatized and free of fluid. Skeleton: No discrete or worrisome osseous lesions. Upper chest: Visualized upper chest demonstrates no acute finding. Other: None. IMPRESSION: 1. Subtle asymmetric heterogeneity of the right submandibular gland, which could reflect changes of early and/or developing acute sialoadenitis. Correlation with physical exam recommended. No obstructive stone identified. No discrete abscess or drainable fluid collection. 2. No other acute abnormality within the neck. Electronically Signed   By: BJeannine BogaM.D.   On: 12/04/2022 23:41    Procedures Procedures    Medications Ordered in ED Medications  iohexol (OMNIPAQUE) 300 MG/ML solution 75 mL (75 mLs Intravenous Contrast Given 12/04/22 2329)  ketorolac (TORADOL) 15 MG/ML injection 15 mg (15 mg Intravenous Given 12/04/22 2357)    ED Course/ Medical Decision Making/ A&P Clinical Course as of 12/05/22 1744  Sat Dec 04, 2022  2305 BMP, CBC unremarkable COVID influenza strep all negative.  CT soft tissue neck with  contrast ordered. [WF]    Clinical Course User Index [WF] FTedd Sias PUtah                            Medical Decision Making Amount and/or Complexity of Data Reviewed Labs: ordered. Radiology: ordered.  Risk Prescription drug management.     This patient presents to the ED for concern of ST, this involves a number of treatment options, and is a complaint that carries with it a moderate risk of complications and morbidity. A differential diagnosis was considered for the patient's symptoms which is discussed below:      Co morbidities: Discussed in HPI   Brief History:  Patient is a 32year old male present emergency room today with complaints of approximately 1 week of sore throat on the right side of his neck just underneath the jaw no fevers nausea vomiting or diarrhea.  No difficulty breathing some discomfort with swallowing.  No changes in phonation.  No dental pain.  He denies any trismus.  He states  that today he noticed some swelling under his right jaw.  Patient with swelling to right jaw.  No dental pain no periapical abscess on my exam.  No anterior cervical chain lymphadenopathy.  Some sore throat but negative for strep COVID flu and RSV.  BMP unremarkable CBC without leukocytosis or anemia.   Doubt Ludwig's angina, did consider strep pharyngitis, mononucleosis, sialoadenitis, dental infection, soft tissue infection/abscess    EMR reviewed including pt PMHx, past surgical history and past visits to ER.   See HPI for more details   Lab Tests:   I personally reviewed all laboratory work and imaging. Metabolic panel without any acute abnormality specifically kidney function within normal limits and no significant electrolyte abnormalities. CBC without leukocytosis or significant anemia.   Imaging Studies:  Abnormal findings. I personally reviewed all imaging studies. Imaging notable for Likely   CT obtained -  IMPRESSION:  1. Subtle asymmetric  heterogeneity of the right submandibular gland,  which could reflect changes of early and/or developing acute  sialoadenitis. Correlation with physical exam recommended. No  obstructive stone identified. No discrete abscess or drainable fluid  collection.  2. No other acute abnormality within the neck.      Electronically Signed    By: Jeannine Boga M.D.    On: 12/04/2022 23:41    Cardiac Monitoring:  NA NA   Medicines ordered:  I ordered medication including Toradol 15 mg for pain Reevaluation of the patient after these medicines showed that the patient improved I have reviewed the patients home medicines and have made adjustments as needed   Critical Interventions:     Consults/Attending Physician   I discussed this case with my attending physician who cosigned this note including patient's presenting symptoms, physical exam, and planned diagnostics and interventions. Attending physician stated agreement with plan or made changes to plan which were implemented.   Reevaluation:  After the interventions noted above I re-evaluated patient and found that they have :improved   Social Determinants of Health:      Problem List / ED Course:  Patient with swelling of the right side of neck.  I do have concern for abscess and obtain CT imaging which shows sialoadenitis.  Will discharge home with recommendations for conservative therapy recommend follow-up with PCP.  Return precautions discussed and sialagogues recommended   Dispostion:  After consideration of the diagnostic results and the patients response to treatment, I feel that the patent would benefit from outpatient follow-up.     Final Clinical Impression(s) / ED Diagnoses Final diagnoses:  Sialoadenitis    Rx / DC Orders ED Discharge Orders     None         Tedd Sias, [place] 12/05/22 1834    Lorelle Gibbs, DO 12/06/22 0025

## 2022-12-04 NOTE — Discharge Instructions (Signed)
Your CT scan shows some evidence of a salivary stone.  Please generally pass on their own.  Sour lozenges, lemon juice, tart candy can increase salivary secretions and help expel the stone.  Warm compresses and some massage can help as well.  Return to emergency room with any redness or worsening swelling or any fevers.  Otherwise follow-up with your primary care doctor.  Please use Tylenol or ibuprofen for pain.  You may use 600 mg ibuprofen every 6 hours or 1000 mg of Tylenol every 6 hours.  You may choose to alternate between the 2.  This would be most effective.  Not to exceed 4 g of Tylenol within 24 hours.  Not to exceed 3200 mg ibuprofen 24 hours.

## 2022-12-05 NOTE — ED Notes (Signed)
Discharge paperwork reviewed entirely with patient, including Rx's and follow up care. Pain was under control. Pt verbalized understanding as well as all parties involved. No questions or concerns voiced at the time of discharge. No acute distress noted.   Pt ambulated out to PVA without incident or assistance.  

## 2023-04-23 ENCOUNTER — Emergency Department (HOSPITAL_BASED_OUTPATIENT_CLINIC_OR_DEPARTMENT_OTHER)
Admission: EM | Admit: 2023-04-23 | Discharge: 2023-04-23 | Disposition: A | Discharge status: Home / Self Care | Payer: 59 | Attending: Emergency Medicine | Admitting: Emergency Medicine

## 2023-04-23 ENCOUNTER — Emergency Department (HOSPITAL_BASED_OUTPATIENT_CLINIC_OR_DEPARTMENT_OTHER): Payer: 59

## 2023-04-23 ENCOUNTER — Other Ambulatory Visit: Payer: Self-pay

## 2023-04-23 ENCOUNTER — Encounter (HOSPITAL_BASED_OUTPATIENT_CLINIC_OR_DEPARTMENT_OTHER): Payer: Self-pay | Admitting: Emergency Medicine

## 2023-04-23 DIAGNOSIS — M79602 Pain in left arm: Secondary | ICD-10-CM | POA: Insufficient documentation

## 2023-04-23 DIAGNOSIS — R42 Dizziness and giddiness: Secondary | ICD-10-CM | POA: Insufficient documentation

## 2023-04-23 DIAGNOSIS — R079 Chest pain, unspecified: Secondary | ICD-10-CM

## 2023-04-23 DIAGNOSIS — I10 Essential (primary) hypertension: Secondary | ICD-10-CM | POA: Diagnosis not present

## 2023-04-23 DIAGNOSIS — R0602 Shortness of breath: Secondary | ICD-10-CM | POA: Insufficient documentation

## 2023-04-23 DIAGNOSIS — Z79899 Other long term (current) drug therapy: Secondary | ICD-10-CM | POA: Diagnosis not present

## 2023-04-23 LAB — CBC WITH DIFFERENTIAL/PLATELET
Abs Immature Granulocytes: 0.01 10*3/uL (ref 0.00–0.07)
Basophils Absolute: 0.1 10*3/uL (ref 0.0–0.1)
Basophils Relative: 1 %
Eosinophils Absolute: 0.2 10*3/uL (ref 0.0–0.5)
Eosinophils Relative: 2 %
HCT: 47.5 % (ref 39.0–52.0)
Hemoglobin: 16.1 g/dL (ref 13.0–17.0)
Immature Granulocytes: 0 %
Lymphocytes Relative: 42 %
Lymphs Abs: 2.7 10*3/uL (ref 0.7–4.0)
MCH: 29 pg (ref 26.0–34.0)
MCHC: 33.9 g/dL (ref 30.0–36.0)
MCV: 85.4 fL (ref 80.0–100.0)
Monocytes Absolute: 0.5 10*3/uL (ref 0.1–1.0)
Monocytes Relative: 7 %
Neutro Abs: 3.2 10*3/uL (ref 1.7–7.7)
Neutrophils Relative %: 48 %
Platelets: 259 10*3/uL (ref 150–400)
RBC: 5.56 MIL/uL (ref 4.22–5.81)
RDW: 13.9 % (ref 11.5–15.5)
WBC: 6.6 10*3/uL (ref 4.0–10.5)
nRBC: 0 % (ref 0.0–0.2)

## 2023-04-23 LAB — COMPREHENSIVE METABOLIC PANEL
ALT: 20 U/L (ref 0–44)
AST: 22 U/L (ref 15–41)
Albumin: 4 g/dL (ref 3.5–5.0)
Alkaline Phosphatase: 69 U/L (ref 38–126)
Anion gap: 9 (ref 5–15)
BUN: 13 mg/dL (ref 6–20)
CO2: 23 mmol/L (ref 22–32)
Calcium: 9 mg/dL (ref 8.9–10.3)
Chloride: 104 mmol/L (ref 98–111)
Creatinine, Ser: 1.1 mg/dL (ref 0.61–1.24)
GFR, Estimated: >60 mL/min (ref >60)
Glucose, Bld: 100 mg/dL — ABNORMAL HIGH (ref 70–99)
Potassium: 3.7 mmol/L (ref 3.5–5.1)
Sodium: 136 mmol/L (ref 135–145)
Total Bilirubin: 0.7 mg/dL (ref 0.3–1.2)
Total Protein: 7.3 g/dL (ref 6.5–8.1)

## 2023-04-23 LAB — TROPONIN I (HIGH SENSITIVITY)
Troponin I (High Sensitivity): 3 ng/L (ref <18)
Troponin I (High Sensitivity): 3 ng/L (ref <18)

## 2023-04-23 MED ORDER — KETOROLAC TROMETHAMINE 15 MG/ML IJ SOLN
15.0000 mg | Freq: Once | INTRAMUSCULAR | Status: AC
  Administered 2023-04-23: 15 mg via INTRAVENOUS
  Filled 2023-04-23: qty 1

## 2023-04-23 MED ORDER — IOHEXOL 350 MG/ML SOLN
100.0000 mL | Freq: Once | INTRAVENOUS | Status: AC | PRN
  Administered 2023-04-23: 100 mL via INTRAVENOUS

## 2023-04-23 NOTE — ED Triage Notes (Signed)
Pt reports SHOB, dizziness and LT side CP, describes as throbbing, since yesterday

## 2023-04-23 NOTE — ED Provider Notes (Signed)
[place] [facility] Provider Note   CSN: [id number] Arrival date & time: 04/23/23  1442     History  Chief Complaint  Patient presents with   Shortness of Breath   Dizziness    Peter Key is a 32 y.o. male, history of hypertension, who presents to the ED secondary to chest pain, shortness of breath this been going on for the last day.  He states that it came on acutely yesterday, on the left side of the breast, and feels pressurized in nature.  He has associated shortness of breath.  And when he gets up he feels a little bit dizzy.  States he also has some pain on the left side of his arm, and intermittently feels numb.  States he has never had this happen before, denies any aggravating factors.  Feels like he is going to pass out when he stands up.    Home Medications Prior to Admission medications   Medication Sig Start Date End Date Taking? Authorizing Provider  candesartan (ATACAND) 16 MG tablet Take 1 tablet (16 mg total) by mouth at bedtime. 05/05/22   Nestor Ramp, MD  linaclotide Karlene Einstein) 72 MCG capsule Take 1 capsule (72 mcg total) by mouth daily before breakfast. 03/05/22   Imogene Burn, MD  pantoprazole (PROTONIX) 20 MG tablet Take 1 tablet (20 mg total) by mouth daily. 03/05/22   Imogene Burn, MD  psyllium (METAMUCIL) 58.6 % powder Take 1 packet by mouth daily.    [provider]      Allergies    Patient has no known allergies.    Review of Systems   Review of Systems  Respiratory:  Positive for shortness of breath.   Cardiovascular:  Positive for chest pain. Negative for leg swelling.  Neurological:  Positive for dizziness.    Physical Exam Updated Vital Signs BP (!) 122/100   Pulse 72   Temp 98.2 F (36.8 C) (Oral)   Resp 18   Ht 5\' 6"  (1.676 m)   Wt 95.3 kg   SpO2 96%   BMI 33.89 kg/m  Physical Exam Vitals and nursing note reviewed.  Constitutional:      General: He is not in acute distress.     Appearance: He is well-developed.  HENT:     Head: Normocephalic and atraumatic.  Eyes:     Conjunctiva/sclera: Conjunctivae normal.  Cardiovascular:     Rate and Rhythm: Normal rate and regular rhythm.     Heart sounds: No murmur heard. Pulmonary:     Effort: Pulmonary effort is normal. No respiratory distress.     Breath sounds: Normal breath sounds.  Abdominal:     Palpations: Abdomen is soft.     Tenderness: There is no abdominal tenderness.  Musculoskeletal:        General: No swelling.     Cervical back: Neck supple.  Skin:    General: Skin is warm and dry.     Capillary Refill: Capillary refill takes less than 2 seconds.  Neurological:     Mental Status: He is alert.  Psychiatric:        Mood and Affect: Mood normal.     ED Results / Procedures / Treatments   Labs (all labs ordered are listed, but only abnormal results are displayed) Labs Reviewed  COMPREHENSIVE METABOLIC PANEL - Abnormal; Notable for the following components:      Result Value   Glucose, Bld 100 (*)  All other components within normal limits  CBC WITH DIFFERENTIAL/PLATELET  TROPONIN I (HIGH SENSITIVITY)  TROPONIN I (HIGH SENSITIVITY)    EKG EKG Interpretation Date/Time:  Saturday April 23 2023 14:53:56 EDT Ventricular Rate:  84 PR Interval:  155 QRS Duration:  92 QT Interval:  353 QTC Calculation: 418 R Axis:   113  Text Interpretation: Sinus rhythm Right axis deviation Borderline ST elevation, anterolateral leads unchanged Confirmed by Gwyneth Sprout ([id number]) on 04/23/2023 3:58:12 PM  Radiology CT Angio Chest/Abd/Pel for Dissection W and/or Wo Contrast  Result Date: 04/23/2023 CLINICAL DATA:  Shortness of breath and chest pain, initial encounter EXAM: CT ANGIOGRAPHY CHEST, ABDOMEN AND PELVIS TECHNIQUE: Non-contrast CT of the chest was initially obtained. Multidetector CT imaging through the chest, abdomen and pelvis was performed using the standard protocol during bolus  administration of intravenous contrast. Multiplanar reconstructed images and MIPs were obtained and reviewed to evaluate the vascular anatomy. RADIATION DOSE REDUCTION: This exam was performed according to the departmental dose-optimization program which includes automated exposure control, adjustment of the mA and/or kV according to patient size and/or use of iterative reconstruction technique. CONTRAST:  OMNIPAQUE IOHEXOL 350 MG/ML SOLN COMPARISON:  Chest x-ray from earlier in the same day. FINDINGS: CTA CHEST FINDINGS Cardiovascular: Precontrast images show no hyperdense crescent to suggest acute aortic injury. 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 The thoracic aorta shows no aneurysmal dilatation or dissection. No cardiac enlargement is seen. The pulmonary artery shows a normal branching pattern without evidence of pulmonary embolism although not timed for embolus evaluation. Mediastinum/Nodes: Thoracic inlet is within normal limits. No hilar or mediastinal adenopathy is noted. The esophagus as visualized is within normal limits. Lungs/Pleura: Lungs are well aerated bilaterally. No focal infiltrate or effusion is seen. Musculoskeletal: No acute abnormality is noted. Review of the MIP images confirms the above findings. CTA ABDOMEN AND PELVIS FINDINGS VASCULAR Aorta: Abdominal aorta shows no aneurysmal dilatation or dissection. Celiac: Patent without evidence of aneurysm, dissection, vasculitis or significant stenosis. SMA: Patent without evidence of aneurysm, dissection, vasculitis or significant stenosis. Renals: Both renal arteries are patent without evidence of aneurysm, dissection, vasculitis, fibromuscular dysplasia or significant stenosis. IMA: Patent without evidence of aneurysm, dissection, vasculitis or significant stenosis. Inflow: Iliacs are widely patent bilaterally. No focal stenosis is seen. Veins: No specific venous abnormality is noted. Review of the MIP images confirms the above  findings. NON-VASCULAR Hepatobiliary: No focal liver abnormality is seen. No gallstones, gallbladder wall thickening, or biliary dilatation. Pancreas: Unremarkable. No pancreatic ductal dilatation or surrounding inflammatory changes. Spleen: Normal in size without focal abnormality. Adrenals/Urinary Tract: Adrenal glands are within normal limits. Kidneys demonstrate a normal enhancement pattern bilaterally. No renal calculi or obstructive changes are seen. The bladder is well distended. Stomach/Bowel: Scattered fecal material is noted throughout the colon. No obstructive or inflammatory changes are identified. The appendix is within normal limits. Ahsley Attwood bowel and stomach are unremarkable. Lymphatic: No sizable adenopathy is noted. Reproductive: Prostate is unremarkable. Other: No abdominal wall hernia or abnormality. No abdominopelvic ascites. Musculoskeletal: No acute or significant osseous findings. Review of the MIP images confirms the above findings. IMPRESSION: CTA of the chest: No acute aortic abnormality is noted. No pulmonary emboli are seen. CT of the abdomen and pelvis: No aneurysmal dilatation of the aorta is seen. No acute abnormality is noted to correspond with the given clinical history. Electronically Signed   By: Alcide Clever M.D.   On: 04/23/2023 19:21   DG Chest 2 View  Result Date:  04/23/2023 CLINICAL DATA:  Chest pain EXAM: CHEST - 2 VIEW COMPARISON:  Chest x-ray September 10, 2022 FINDINGS: The cardiomediastinal silhouette is unchanged in contour. No focal pulmonary opacity. No pleural effusion or pneumothorax. The visualized upper abdomen is unremarkable. No acute osseous abnormality. IMPRESSION: No active cardiopulmonary disease. Electronically Signed   By: Jacob Moores M.D.   On: 04/23/2023 15:33    Procedures Procedures    Medications Ordered in ED Medications  iohexol (OMNIPAQUE) 350 MG/ML injection 100 mL (100 mLs Intravenous Contrast Given 04/23/23 1801)  ketorolac  (TORADOL) 15 MG/ML injection 15 mg (15 mg Intravenous Given 04/23/23 1837)    ED Course/ Medical Decision Making/ A&P                             Medical Decision Making Patient is a 32 year old male, here for chest pain, shortness of breath, and dizziness.  He states he almost passes out when he gets up.  His orthostatics are reassuring, however we will obtain a CTA chest/abdomen/pelvis given his shortness of breath, dizziness, and left-sided chest pain with neurodeficits.  Amount and/or Complexity of Data Reviewed Labs: ordered.    Details: Labs reassuring, troponins within normal limits Radiology: ordered.    Details: CTA chest/abdomen/pelvis reassuring, negative for any acute findings Discussion of management or test interpretation with external provider(s): Discussed with patient, unclear etiology causing the chest pain, shortness of breath, dizziness.  His CTA chest/abdomen/pelvis was unremarkable did not show any PE, or dissection.  Cause of chest pain shortness of breath unclear etiology.  His orthostatics are reassuring.  I encouraged him to follow-up with a primary care doctor, and he voiced understanding.  Troponins within normal limits  Risk Prescription drug management.   Final Clinical Impression(s) / ED Diagnoses Final diagnoses:  Chest pain, unspecified type  SOB (shortness of breath)  Dizziness    Rx / DC Orders ED Discharge Orders     None         Henryetta Corriveau, Harley [place], PA 04/23/23 2155    Gwyneth Sprout, MD 04/23/23 2251

## 2023-04-23 NOTE — Discharge Instructions (Signed)
Please follow-up with your primary care doctor, return to the ER if you feel like your symptoms are getting worse.  Your x-rays, CTs were all reassuring as well as your lab work.  Patient's

## 2023-09-09 ENCOUNTER — Emergency Department (HOSPITAL_BASED_OUTPATIENT_CLINIC_OR_DEPARTMENT_OTHER)
Admission: EM | Admit: 2023-09-09 | Discharge: 2023-09-09 | Disposition: A | Discharge status: Home / Self Care | Payer: 59 | Attending: Emergency Medicine | Admitting: Emergency Medicine

## 2023-09-09 ENCOUNTER — Other Ambulatory Visit: Payer: Self-pay

## 2023-09-09 ENCOUNTER — Encounter (HOSPITAL_BASED_OUTPATIENT_CLINIC_OR_DEPARTMENT_OTHER): Payer: Self-pay

## 2023-09-09 ENCOUNTER — Emergency Department (HOSPITAL_BASED_OUTPATIENT_CLINIC_OR_DEPARTMENT_OTHER): Payer: 59

## 2023-09-09 DIAGNOSIS — J069 Acute upper respiratory infection, unspecified: Secondary | ICD-10-CM | POA: Diagnosis not present

## 2023-09-09 DIAGNOSIS — Z20822 Contact with and (suspected) exposure to covid-19: Secondary | ICD-10-CM | POA: Insufficient documentation

## 2023-09-09 DIAGNOSIS — R0602 Shortness of breath: Secondary | ICD-10-CM | POA: Diagnosis present

## 2023-09-09 DIAGNOSIS — M25532 Pain in left wrist: Secondary | ICD-10-CM | POA: Insufficient documentation

## 2023-09-09 LAB — RESP PANEL BY RT-PCR (RSV, FLU A&B, COVID)  RVPGX2
Influenza A by PCR: NEGATIVE
Influenza B by PCR: NEGATIVE
Resp Syncytial Virus by PCR: NEGATIVE
SARS Coronavirus 2 by RT PCR: NEGATIVE

## 2023-09-09 LAB — CBC WITH DIFFERENTIAL/PLATELET
Abs Immature Granulocytes: 0.01 10*3/uL (ref 0.00–0.07)
Basophils Absolute: 0.1 10*3/uL (ref 0.0–0.1)
Basophils Relative: 1 %
Eosinophils Absolute: 0.3 10*3/uL (ref 0.0–0.5)
Eosinophils Relative: 5 %
HCT: 43.9 % (ref 39.0–52.0)
Hemoglobin: 14.9 g/dL (ref 13.0–17.0)
Immature Granulocytes: 0 %
Lymphocytes Relative: 41 %
Lymphs Abs: 2.8 10*3/uL (ref 0.7–4.0)
MCH: 29.3 pg (ref 26.0–34.0)
MCHC: 33.9 g/dL (ref 30.0–36.0)
MCV: 86.4 fL (ref 80.0–100.0)
Monocytes Absolute: 0.8 10*3/uL (ref 0.1–1.0)
Monocytes Relative: 12 %
Neutro Abs: 2.8 10*3/uL (ref 1.7–7.7)
Neutrophils Relative %: 41 %
Platelets: 245 10*3/uL (ref 150–400)
RBC: 5.08 MIL/uL (ref 4.22–5.81)
RDW: 13.4 % (ref 11.5–15.5)
WBC: 6.8 10*3/uL (ref 4.0–10.5)
nRBC: 0 % (ref 0.0–0.2)

## 2023-09-09 LAB — BASIC METABOLIC PANEL
Anion gap: 6 (ref 5–15)
BUN: 10 mg/dL (ref 6–20)
CO2: 29 mmol/L (ref 22–32)
Calcium: 8.5 mg/dL — ABNORMAL LOW (ref 8.9–10.3)
Chloride: 100 mmol/L (ref 98–111)
Creatinine, Ser: 1.12 mg/dL (ref 0.61–1.24)
GFR, Estimated: >60 mL/min (ref >60)
Glucose, Bld: 121 mg/dL — ABNORMAL HIGH (ref 70–99)
Potassium: 3.6 mmol/L (ref 3.5–5.1)
Sodium: 135 mmol/L (ref 135–145)

## 2023-09-09 LAB — TROPONIN I (HIGH SENSITIVITY): Troponin I (High Sensitivity): 3 ng/L (ref <18)

## 2023-09-09 LAB — D-DIMER, QUANTITATIVE: D-Dimer, Quant: <0.27 ug{FEU}/mL (ref 0.00–0.50)

## 2023-09-09 MED ORDER — BENZONATATE 100 MG PO CAPS: 100.0000 mg | ORAL_CAPSULE | Freq: Three times a day (TID) | ORAL | 0 refills | Status: DC | PRN

## 2023-09-09 MED ORDER — ALBUTEROL SULFATE HFA 108 (90 BASE) MCG/ACT IN AERS: 2.0000 | INHALATION_SPRAY | Freq: Four times a day (QID) | RESPIRATORY_TRACT | 0 refills | Status: DC | PRN

## 2023-09-09 NOTE — ED Triage Notes (Addendum)
Pt reports shortness of breath, central chest pain, nasal congestion & body aches; onset yesterday. Denies fever/chills, sore throat. He also is reporting left wrist pain that radiates up his arm x "a couple of months." Denies injury.

## 2023-09-09 NOTE — ED Provider Notes (Signed)
[place] [facility] Provider Note   CSN: [id number] Arrival date & time: 09/09/23  0011     History  Chief Complaint  Patient presents with   Shortness of Breath    Peter Key is a 32 y.o. male.  Patient with a history of sleep apnea presenting with 2-day history of chest congestion, tightness, shortness of breath and coughing.  States he feels tightness in his chest for 24 hours now short of breath with congestion in his nose and bodyaches.  Has been difficulty wearing his CPAP due to congestion.  Cough productive of clear mucus.  Clear rhinorrhea as well.  Tightness in his chest with no radiation.  Body aches but no fever or chills.  No sore throat or headache.  No leg pain or leg swelling.  No vomiting, diarrhea, abdominal pain.  No travel or sick contacts.  Denies any cardiac or pulmonary history.  Also complains of left-sided wrist pain ongoing for the past several months that is constant.  Radiates up his entire left arm.  No weakness, numbness or tingling.  No injury.  Does work on [employer].  He is right-handed.  The history is provided by the patient.  Shortness of Breath Associated symptoms: cough   Associated symptoms: no abdominal pain, no chest pain, no fever, no headaches, no rash, no sore throat and no vomiting        Home Medications Prior to Admission medications   Medication Sig Start Date End Date Taking? Authorizing Provider  candesartan (ATACAND) 16 MG tablet Take 1 tablet (16 mg total) by mouth at bedtime. 05/05/22   Nestor Ramp, MD  linaclotide Karlene Einstein) 72 MCG capsule Take 1 capsule (72 mcg total) by mouth daily before breakfast. 03/05/22   Imogene Burn, MD  pantoprazole (PROTONIX) 20 MG tablet Take 1 tablet (20 mg total) by mouth daily. 03/05/22   Imogene Burn, MD  psyllium (METAMUCIL) 58.6 % powder Take 1 packet by mouth daily.    [provider]      Allergies    Patient has no known allergies.     Review of Systems   Review of Systems  Constitutional:  Negative for activity change, appetite change and fever.  HENT:  Positive for congestion and rhinorrhea. Negative for sore throat.   Respiratory:  Positive for cough, chest tightness and shortness of breath.   Cardiovascular:  Negative for chest pain.  Gastrointestinal:  Negative for abdominal pain, nausea and vomiting.  Genitourinary:  Negative for dysuria and hematuria.  Musculoskeletal:  Positive for arthralgias and myalgias.  Skin:  Negative for rash.  Neurological:  Negative for weakness and headaches.   all other systems are negative except as noted in the HPI and PMH.    Physical Exam Updated Vital Signs BP (!) 130/96   Pulse 87   Temp 98.4 F (36.9 C) (Oral)   Resp 16   Ht 5\' 6"  (1.676 m)   Wt 95.3 kg   SpO2 99%   BMI 33.89 kg/m  Physical Exam Vitals and nursing note reviewed.  Constitutional:      General: He is not in acute distress.    Appearance: He is well-developed. He is not ill-appearing.  HENT:     Head: Normocephalic and atraumatic.     Right Ear: Tympanic membrane normal.     Left Ear: Tympanic membrane normal.     Nose: Congestion present.     Mouth/Throat:  Mouth: Mucous membranes are moist.     Pharynx: No oropharyngeal exudate.  Eyes:     Conjunctiva/sclera: Conjunctivae normal.     Pupils: Pupils are equal, round, and reactive to light.  Neck:     Comments: No meningismus. Cardiovascular:     Rate and Rhythm: Normal rate and regular rhythm.     Heart sounds: Normal heart sounds. No murmur heard. Pulmonary:     Effort: Pulmonary effort is normal. No respiratory distress.     Breath sounds: Normal breath sounds. No wheezing.  Chest:     Chest wall: No tenderness.  Abdominal:     Palpations: Abdomen is soft.     Tenderness: There is no abdominal tenderness. There is no guarding or rebound.  Musculoskeletal:        General: No tenderness. Normal range of motion.     Cervical  back: Normal range of motion and neck supple.     Comments: Left wrist nontender to palpation.  No warmth or erythema.  Cardinal hand movements intact.  Intact radial pulse.  Tinel's sign negative, Phalen sign negative  Skin:    General: Skin is warm.  Neurological:     Mental Status: He is alert and oriented to person, place, and time.     Cranial Nerves: No cranial nerve deficit.     Motor: No abnormal muscle tone.     Coordination: Coordination normal.     Comments:  5/5 strength throughout. CN 2-12 intact.Equal grip strength.   Psychiatric:        Behavior: Behavior normal.     ED Results / Procedures / Treatments   Labs (all labs ordered are listed, but only abnormal results are displayed) Labs Reviewed  BASIC METABOLIC PANEL - Abnormal; Notable for the following components:      Result Value   Glucose, Bld 121 (*)    Calcium 8.5 (*)    All other components within normal limits  RESP PANEL BY RT-PCR (RSV, FLU A&B, COVID)  RVPGX2  CBC WITH DIFFERENTIAL/PLATELET  D-DIMER, QUANTITATIVE  TROPONIN I (HIGH SENSITIVITY)  TROPONIN I (HIGH SENSITIVITY)    EKG EKG Interpretation Date/Time:  Friday September 09 2023 00:23:59 EST Ventricular Rate:  78 PR Interval:  159 QRS Duration:  102 QT Interval:  370 QTC Calculation: 422 R Axis:   90  Text Interpretation: Sinus rhythm Borderline right axis deviation No significant change was found Confirmed by Glynn Octave [phone]) on 09/09/2023 12:31:05 AM  Radiology DG Wrist Complete Left  Result Date: 09/09/2023 CLINICAL DATA:  Left wrist pain for 3 months. EXAM: LEFT WRIST - COMPLETE 3+ VIEW COMPARISON:  07/02/2023 FINDINGS: There is no evidence of fracture or dislocation. There is no evidence of arthropathy or other focal bone abnormality. Soft tissues are unremarkable. IMPRESSION: Negative. Electronically Signed   By: Minerva Fester M.D.   On: 09/09/2023 01:14   DG Chest 2 View  Result Date: 09/09/2023 CLINICAL DATA:   Cough and congestion EXAM: CHEST - 2 VIEW COMPARISON:  Radiograph and CT 04/23/2023 FINDINGS: The heart size and mediastinal contours are within normal limits. Both lungs are clear. The visualized skeletal structures are unremarkable. IMPRESSION: No active cardiopulmonary disease. Electronically Signed   By: Minerva Fester M.D.   On: 09/09/2023 01:13    Procedures Procedures    Medications Ordered in ED Medications - No data to display  ED Course/ Medical Decision Making/ A&P  Medical Decision Making Amount and/or Complexity of Data Reviewed Labs: ordered. Decision-making details documented in ED Course. Radiology: ordered and independent interpretation performed. Decision-making details documented in ED Course. ECG/medicine tests: ordered and independent interpretation performed. Decision-making details documented in ED Course.   1 day of URI symptoms with chest tightness, shortness of breath, nasal congestion, body aches.  No fever.  No hypoxia or increased work of breathing.  EKG is sinus rhythm without acute ST changes.  Likely viral URI with cough.  Low suspicion for ACS or PE.  Will check chest x-ray, screening for COVID and flu.  Left wrist pain possibly secondary to carpal tunnel syndrome.  Low suspicion for bony abnormality.  Chest x-ray negative for infiltrate or pneumothorax.  Wrist x-ray negative.  Results reviewed and interpreted by me.  Labs are reassuring.  Negative D-dimer.  Negative troponin.  Low suspicion for ACS or PE.  COVID and flu swabs are negative.  Suspect likely viral URI causing symptoms.  Will treat supportively.  Ambulatory without desaturation.  No hypoxia or increased work of breathing.  No evidence of PE or ACS.  Follow-up with PCP.  Return to the ED with exertional chest pain, pain associate with shortness of breath, nausea, vomiting, sweating or other concerns        Final Clinical Impression(s) / ED  Diagnoses Final diagnoses:  None    Rx / DC Orders ED Discharge Orders     None         Aoki Wedemeyer, Jeannett Senior, MD 09/09/23 0300

## 2023-09-09 NOTE — Discharge Instructions (Signed)
Your COVID and flu tests are negative.  Take the cough medication as prescribed.  You are being treated for likely viral bronchitis or respiratory infection.  No evidence of heart attack, pneumonia or blood clot in the lung.  Follow-up with your doctor.  Return to the ED with exertional chest pain, pain associate with shortness of breath, nausea, vomiting, sweating, fever or any concerns

## 2023-10-17 ENCOUNTER — Encounter: Payer: Self-pay | Admitting: Family Medicine

## 2023-10-24 ENCOUNTER — Other Ambulatory Visit: Payer: Self-pay

## 2023-10-24 ENCOUNTER — Emergency Department (HOSPITAL_BASED_OUTPATIENT_CLINIC_OR_DEPARTMENT_OTHER): Payer: 59 | Admitting: Radiology

## 2023-10-24 ENCOUNTER — Encounter (HOSPITAL_BASED_OUTPATIENT_CLINIC_OR_DEPARTMENT_OTHER): Payer: Self-pay

## 2023-10-24 ENCOUNTER — Emergency Department (HOSPITAL_BASED_OUTPATIENT_CLINIC_OR_DEPARTMENT_OTHER)
Admission: EM | Admit: 2023-10-24 | Discharge: 2023-10-24 | Disposition: A | Discharge status: Home / Self Care | Payer: 59 | Attending: Emergency Medicine | Admitting: Emergency Medicine

## 2023-10-24 DIAGNOSIS — J181 Lobar pneumonia, unspecified organism: Secondary | ICD-10-CM | POA: Diagnosis not present

## 2023-10-24 DIAGNOSIS — Z20822 Contact with and (suspected) exposure to covid-19: Secondary | ICD-10-CM | POA: Diagnosis not present

## 2023-10-24 DIAGNOSIS — J189 Pneumonia, unspecified organism: Secondary | ICD-10-CM

## 2023-10-24 DIAGNOSIS — R0602 Shortness of breath: Secondary | ICD-10-CM | POA: Diagnosis present

## 2023-10-24 LAB — RESP PANEL BY RT-PCR (RSV, FLU A&B, COVID)  RVPGX2
Influenza A by PCR: NEGATIVE
Influenza B by PCR: NEGATIVE
Resp Syncytial Virus by PCR: NEGATIVE
SARS Coronavirus 2 by RT PCR: NEGATIVE

## 2023-10-24 MED ORDER — DOXYCYCLINE HYCLATE 100 MG PO CAPS: 100.0000 mg | ORAL_CAPSULE | Freq: Two times a day (BID) | ORAL | 0 refills | Status: DC

## 2023-10-24 NOTE — ED Provider Notes (Signed)
[place] [facility] AT [facility] Provider Note   CSN: [id number] Arrival date & time: 10/24/23  1432     History  Chief Complaint  Patient presents with   URI    Peter Key is a 32 y.o. male.  Patient complains of a cough and congestion that started on December 23.  Patient reports he has had some shortness of breath on and off.  Patient reports feeling some fever and chills.  Patient has had bodyaches.  Patient denies any known exposure to flu or COVID.  Patient has a past medical history of sleep apnea.  He uses CPAP at night.  He reports he has had difficulty sleeping due to the cough and congestion.  The history is provided by the patient. No language interpreter was used.  URI      Home Medications Prior to Admission medications   Medication Sig Start Date End Date Taking? Authorizing Provider  doxycycline (VIBRAMYCIN) 100 MG capsule Take 1 capsule (100 mg total) by mouth 2 (two) times daily. 10/24/23  Yes Cheron Schaumann K, PA-C  albuterol (VENTOLIN HFA) 108 (90 Base) MCG/ACT inhaler Inhale 2 puffs into the lungs every 6 (six) hours as needed for wheezing or shortness of breath. 09/09/23   Rancour, Jeannett Senior, MD  benzonatate (TESSALON) 100 MG capsule Take 1 capsule (100 mg total) by mouth 3 (three) times daily as needed for cough. 09/09/23   Rancour, Jeannett Senior, MD  candesartan (ATACAND) 16 MG tablet Take 1 tablet (16 mg total) by mouth at bedtime. 05/05/22   Nestor Ramp, MD  linaclotide Karlene Einstein) 72 MCG capsule Take 1 capsule (72 mcg total) by mouth daily before breakfast. 03/05/22   Imogene Burn, MD  pantoprazole (PROTONIX) 20 MG tablet Take 1 tablet (20 mg total) by mouth daily. 03/05/22   Imogene Burn, MD  psyllium (METAMUCIL) 58.6 % powder Take 1 packet by mouth daily.    [provider]      Allergies    Patient has no known allergies.    Review of Systems   Review of Systems  All other systems reviewed and are  negative.   Physical Exam Updated Vital Signs BP (!) 134/98 (BP Location: Right Arm)   Pulse 90   Temp 98.6 F (37 C) (Oral)   Resp 18   SpO2 98%  Physical Exam Vitals and nursing note reviewed.  Constitutional:      Appearance: He is well-developed.  HENT:     Head: Normocephalic.  Cardiovascular:     Rate and Rhythm: Normal rate.  Pulmonary:     Effort: Pulmonary effort is normal.     Breath sounds: Normal breath sounds.  Abdominal:     General: There is no distension.  Musculoskeletal:        General: Normal range of motion.     Cervical back: Normal range of motion.  Skin:    General: Skin is warm.  Neurological:     General: No focal deficit present.     Mental Status: He is alert and oriented to person, place, and time.     ED Results / Procedures / Treatments   Labs (all labs ordered are listed, but only abnormal results are displayed) Labs Reviewed  RESP PANEL BY RT-PCR (RSV, FLU A&B, COVID)  RVPGX2    EKG EKG Interpretation Date/Time:  Monday October 24 2023 14:49:34 EST Ventricular Rate:  85 PR Interval:  146 QRS Duration:  100 QT Interval:  344 QTC  Calculation: 409 R Axis:   101  Text Interpretation: Normal sinus rhythm Rightward axis  no significant change since Nov 2024 Confirmed by Pricilla Loveless [phone]) on 10/24/2023 6:46:17 PM  Radiology DG Chest 2 View Result Date: 10/24/2023 CLINICAL DATA:  Cough. EXAM: CHEST - 2 VIEW COMPARISON:  09/09/2023 FINDINGS: Minimal patchy opacity in the right upper lung zone, new from prior exam. The left lung is clear. Stable heart size and mediastinal contours. No pleural effusion or pneumothorax. No acute osseous findings. IMPRESSION: Minimal patchy opacity in the right upper lung zone, suspicious for pneumonia. Electronically Signed   By: Narda Rutherford M.D.   On: 10/24/2023 18:38    Procedures Procedures    Medications Ordered in ED Medications - No data to display  ED Course/ Medical Decision  Making/ A&P                                 Medical Decision Making Patient complains of a cough and congestion since 1223.  Amount and/or Complexity of Data Reviewed Labs: ordered. Decision-making details documented in ED Course.    Details: Labs ordered reviewed and interpreted RSV COVID and influenza are negative Radiology: ordered and independent interpretation performed. Decision-making details documented in ED Course.    Details: Chest x-ray shows right upper lobe pneumonia  Risk Prescription drug management. Risk Details: And is given a prescription for doxycycline.  He is advised to follow-up with his primary care physician for recheck in 1 week.  He is to return to the emergency department if any problems           Final Clinical Impression(s) / ED Diagnoses Final diagnoses:  Community acquired pneumonia of right upper lobe of lung    Rx / DC Orders ED Discharge Orders          Ordered    doxycycline (VIBRAMYCIN) 100 MG capsule  2 times daily        10/24/23 1925          An After Visit Summary was printed and given to the patient.     Elson Areas, [place] 10/24/23 1932    Pricilla Loveless, MD 10/24/23 [phone]

## 2023-10-24 NOTE — ED Triage Notes (Signed)
Pt c/o URI symptoms, onset Tuesday before Christmas. OTC meds for symptoms

## 2023-10-25 ENCOUNTER — Other Ambulatory Visit: Payer: Self-pay | Admitting: Family Medicine

## 2023-10-25 MED ORDER — ALBUTEROL SULFATE HFA 108 (90 BASE) MCG/ACT IN AERS: 2.0000 | INHALATION_SPRAY | Freq: Four times a day (QID) | RESPIRATORY_TRACT | 0 refills | Status: AC | PRN

## 2023-10-25 NOTE — Progress Notes (Signed)
 Recently dx CAP. Wants refill on inhaler.

## 2023-11-02 ENCOUNTER — Encounter: Payer: Self-pay | Admitting: Family Medicine

## 2023-11-02 ENCOUNTER — Ambulatory Visit (INDEPENDENT_AMBULATORY_CARE_PROVIDER_SITE_OTHER): Payer: 59 | Admitting: Family Medicine

## 2023-11-02 VITALS — BP 120/72 | HR 79 | Ht 66.0 in | Wt 223.6 lb

## 2023-11-02 DIAGNOSIS — E669 Obesity, unspecified: Secondary | ICD-10-CM

## 2023-11-02 DIAGNOSIS — I10 Essential (primary) hypertension: Secondary | ICD-10-CM | POA: Diagnosis not present

## 2023-11-02 DIAGNOSIS — M5414 Radiculopathy, thoracic region: Secondary | ICD-10-CM

## 2023-11-02 DIAGNOSIS — G4733 Obstructive sleep apnea (adult) (pediatric): Secondary | ICD-10-CM

## 2023-11-02 DIAGNOSIS — Z0000 Encounter for general adult medical examination without abnormal findings: Secondary | ICD-10-CM | POA: Diagnosis not present

## 2023-11-02 DIAGNOSIS — Z23 Encounter for immunization: Secondary | ICD-10-CM | POA: Diagnosis not present

## 2023-11-02 LAB — POCT GLYCOSYLATED HEMOGLOBIN (HGB A1C): Hemoglobin A1C: 5.8 % — AB (ref 4.0–5.6)

## 2023-11-02 NOTE — Patient Instructions (Addendum)
 I would recommend a follow up appointment in March just to check in on your recovery from pneumonia. PT should call you in next 7-10 days. Let me know if they have not. Great to see you!

## 2023-11-04 ENCOUNTER — Encounter: Payer: Self-pay | Admitting: Family Medicine

## 2023-11-04 NOTE — Progress Notes (Signed)
    CHIEF COMPLAINT / HPI: Physical and follow-up recent diagnosis of pneumonia.  Doing much better from respiratory standpoint.  Does have some questions about whether or not we need to repeat chest x-ray today or at some point to make sure the pneumonia has cleared.  Regarding health maintenance, and wants to make sure he is caught up-to-date on everything.  Really wants to make sure he is on top of his health in the next year.  #3.  Having some numbness and tingling in the left arm starts in the posterior proximal tricep area and goes down the arm.  Sometimes it travels all the way to the fourth and fifth fingers.  Really bothering him.   PERTINENT  PMH / PSH: I have reviewed the patient's medications, allergies, past medical and surgical history, smoking status and updated in the EMR as appropriate.   OBJECTIVE:  BP 120/72   Pulse 79   Ht 5' 6 (1.676 m)   Wt 223 lb 9.6 oz (101.4 kg)   SpO2 99%   BMI 36.09 kg/m  Vital signs reviewed. GENERAL: Well-developed, well-nourished, no acute distress. CARDIOVASCULAR: Regular rate and rhythm no murmur gallop or rub LUNGS: Clear to auscultation bilaterally, no rales or wheeze. ABDOMEN: Soft positive bowel sounds NEURO: No gross focal neurological deficits.  Negative Spurling's. MSK: Movement of extremity x 4.  Tender to palpation in the left rhomboid and left trapezius area deep palpation here reproduces some of his paresthesias in the left arm. PSYCH: AxOx4. Good eye contact.. No psychomotor retardation or agitation. Appropriate speech fluency and content. Asks and answers questions appropriately. Mood is congruent.   ASSESSMENT / PLAN: Well adult physical.  Will check some labs today.  He is mostly concerned about weight and possible diabetes mellitus. #2.  Left arm paresthesias.  Could be cervical radiculopathy but I think it is coming from the thoracic area given to postural changes as he does a lot of computer work.  Will set up with  physical therapy with goal to have home exercise program explained.  Follow-up 4 to 6 weeks.  No problem-specific Assessment & Plan notes found for this encounter.   Camie Mulch MD

## 2023-12-28 ENCOUNTER — Telehealth: Payer: Self-pay

## 2023-12-28 ENCOUNTER — Ambulatory Visit: Payer: 59 | Admitting: Family Medicine

## 2023-12-28 ENCOUNTER — Ambulatory Visit
Admission: RE | Admit: 2023-12-28 | Discharge: 2023-12-28 | Disposition: A | Discharge status: Home / Self Care | Source: Ambulatory Visit | Attending: Family Medicine | Admitting: Family Medicine

## 2023-12-28 ENCOUNTER — Encounter: Payer: Self-pay | Admitting: Family Medicine

## 2023-12-28 VITALS — BP 120/84 | HR 93 | Ht 66.0 in | Wt 235.8 lb

## 2023-12-28 DIAGNOSIS — R030 Elevated blood-pressure reading, without diagnosis of hypertension: Secondary | ICD-10-CM

## 2023-12-28 DIAGNOSIS — J189 Pneumonia, unspecified organism: Secondary | ICD-10-CM | POA: Diagnosis not present

## 2023-12-28 DIAGNOSIS — F17200 Nicotine dependence, unspecified, uncomplicated: Secondary | ICD-10-CM

## 2023-12-28 DIAGNOSIS — I10 Essential (primary) hypertension: Secondary | ICD-10-CM | POA: Diagnosis not present

## 2023-12-28 DIAGNOSIS — R42 Dizziness and giddiness: Secondary | ICD-10-CM | POA: Diagnosis not present

## 2023-12-28 NOTE — Patient Instructions (Addendum)
 We are drawing quite a few labs today and I will send you a note through MyChart.  I will call you if any of them are really abnormal.  I have also placed an order for you to have a chest x-ray as follow-up to your previous pneumonia.  You can get that done between 830 and 4 Monday through Friday at [facility] imaging on [address].  We are setting up to see Dr. Raymondo Band to see if he can get an ambulatory blood pressure monitor placed for you.  He is also our expert on smoking cessation.  I will have a message sent to him so he can call you and get you in at a convenient time for both of you when he has an ambulatory blood pressure monitor.  He will be calling from the clinic number most likely.    I think the squishy feeling you are having in your head is potentially related to blood pressure.  If that is negative, the next step would be to set you up with a heart monitor that looks at rhythm and that is called a Zio patch.  We also have availability of those here.  So there is lots of things to do to further explore this.  It would be great if I can see you back in about 3 to 4 weeks so we can follow-up on this.  If you have any worsening of symptoms or any new symptoms in the interim, please let me know.  Great to see you!

## 2023-12-28 NOTE — Telephone Encounter (Signed)
 Called patient to inform him about an upcoming appointment with Dr. Raymondo Band.  Set for 01/03/2024 at 8:30am. Did inform him that the appointment date and time will be in his MyChart as well.  Drusilla Kanner, CMA

## 2023-12-29 LAB — COMPREHENSIVE METABOLIC PANEL WITH GFR
ALT: 30 [IU]/L (ref 0–44)
AST: 24 [IU]/L (ref 0–40)
Albumin: 4.5 g/dL (ref 4.1–5.1)
Alkaline Phosphatase: 93 [IU]/L (ref 44–121)
BUN/Creatinine Ratio: 12 (ref 9–20)
BUN: 15 mg/dL (ref 6–20)
Bilirubin Total: 0.3 mg/dL (ref 0.0–1.2)
CO2: 24 mmol/L (ref 20–29)
Calcium: 9.2 mg/dL (ref 8.7–10.2)
Chloride: 102 mmol/L (ref 96–106)
Creatinine, Ser: 1.21 mg/dL (ref 0.76–1.27)
Globulin, Total: 2.4 g/dL (ref 1.5–4.5)
Glucose: 103 mg/dL — ABNORMAL HIGH (ref 70–99)
Potassium: 4.3 mmol/L (ref 3.5–5.2)
Sodium: 138 mmol/L (ref 134–144)
Total Protein: 6.9 g/dL (ref 6.0–8.5)
eGFR: 81 mL/min/{1.73_m2}

## 2023-12-29 LAB — CBC
Hematocrit: 46.8 % (ref 37.5–51.0)
Hemoglobin: 15.7 g/dL (ref 13.0–17.7)
MCH: 29 pg (ref 26.6–33.0)
MCHC: 33.5 g/dL (ref 31.5–35.7)
MCV: 86 fL (ref 79–97)
Platelets: 273 10*3/uL (ref 150–450)
RBC: 5.42 x10E6/uL (ref 4.14–5.80)
RDW: 13.2 % (ref 11.6–15.4)
WBC: 6.9 10*3/uL (ref 3.4–10.8)

## 2023-12-29 LAB — LIPID PANEL
Chol/HDL Ratio: 3.8 ratio (ref 0.0–5.0)
Cholesterol, Total: 174 mg/dL (ref 100–199)
HDL: 46 mg/dL
LDL Chol Calc (NIH): 112 mg/dL — ABNORMAL HIGH (ref 0–99)
Triglycerides: 89 mg/dL (ref 0–149)
VLDL Cholesterol Cal: 16 mg/dL (ref 5–40)

## 2023-12-29 LAB — ANA: Anti Nuclear Antibody (ANA): NEGATIVE

## 2023-12-29 LAB — TSH: TSH: 1.13 u[IU]/mL (ref 0.450–4.500)

## 2023-12-29 NOTE — Progress Notes (Signed)
    CHIEF COMPLAINT / HPI: #1.  Follow-up pneumonia: Doing much better.  Does want to get repeat chest x-ray to make sure it has resolved. 2.  Smoking cessation desired. 3.  Elevated blood pressure intermittently.  He had stopped his medication. 4.  Squishy feeling in his head that occurs most days.  Not related specifically to activity.  Cannot really further describe it.  Not feeling heart palpitations with it.  Does seem to be a little bit like pressure in the head.  No visual changes with it.  No other associated symptoms such as chest pain or sweating with it.  No dizziness.   PERTINENT  PMH / PSH: I have reviewed the patient's medications, allergies, past medical and surgical history, smoking status and updated in the EMR as appropriate.   OBJECTIVE:  BP 120/84   Pulse 93   Ht 5\' 6"  (1.676 m)   Wt 235 lb 12.8 oz (107 kg)   SpO2 99%   BMI 38.06 kg/m   Vital signs reviewed. GENERAL: Well-developed, well-nourished, no acute distress. CARDIOVASCULAR: Regular rate and rhythm no murmur gallop or rub LUNGS: Clear to auscultation bilaterally, no rales or wheeze. ABDOMEN: Soft positive bowel sounds NEURO: No gross focal neurological deficits. MSK: Movement of extremity x 4. PSYCH: AxOx4. Good eye contact.. No psychomotor retardation or agitation. Appropriate speech fluency and content. Asks and answers questions appropriately. Mood is congruent.  ASSESSMENT / PLAN: Repeat chest x-ray to follow-up on his community-acquired pneumonia.  Hypertension I think he is probably spending more time in the hypertensive range than he realizes.  I will try to set him up with ambulatory blood pressure monitor  Smoker Appointment pharmacy clinic for smoking cessation discussion.   Denny Levy MD

## 2023-12-29 NOTE — Assessment & Plan Note (Signed)
 Appointment pharmacy clinic for smoking cessation discussion.

## 2023-12-29 NOTE — Assessment & Plan Note (Addendum)
 I think he is probably spending more time in the hypertensive range than he realizes.  I will try to set him up with ambulatory blood pressure monitor

## 2024-01-03 ENCOUNTER — Encounter: Payer: Self-pay | Admitting: Pharmacist

## 2024-01-03 ENCOUNTER — Ambulatory Visit: Admitting: Pharmacist

## 2024-01-03 VITALS — BP 144/93 | Wt 231.0 lb

## 2024-01-03 DIAGNOSIS — I10 Essential (primary) hypertension: Secondary | ICD-10-CM | POA: Diagnosis not present

## 2024-01-03 DIAGNOSIS — F17200 Nicotine dependence, unspecified, uncomplicated: Secondary | ICD-10-CM

## 2024-01-03 NOTE — Progress Notes (Signed)
 S:     Chief Complaint  Patient presents with   Medication Management    Amb BP   33 y.o. male who presents for hypertension evaluation, education, and management. Patient arrives in fairly good spirits and presents without any assistance.   PMH is significant for HTN, asthma, GERD, OSA, history of tobacco use.  Patient was referred and last seen by Primary Care Provider, Dr. Jennette Kettle, on 12/28/23.   At last visit, follow-up for pneumonia. BP elevated intermittently and patient reported desire for smoking cessation   Medication compliance is reported to be non-adherent. Patient reports no need to take albuterol or pantoprazole for the last year. Last dose reported sometime last year.  Discussed procedure for wearing the monitor and gave patient written instructions. Monitor was placed on non-dominant arm with instructions to return in the morning.   Current BP Medications include: none  Antihypertensives tried in the past include: candesartan, propranolol  Number of cigarettes/day 6-7 (Pack lasts 3 days)  Most recent quit attempt 2020 Longest time ever been tobacco free 2-3 years. Restarted smoking last March (2024) due to stress. Patient reports readiness to quit, but expresses it'll be harder to quit than when he attempted in 2020.  Medications used in past cessation efforts include: none Rates IMPORTANCE of quitting tobacco on 1-10 scale of 10 Rates CONFIDENCE of quitting tobacco on 1-10 scale of 4  O:  Review of Systems  All other systems reviewed and are negative.  Physical Exam Vitals reviewed.  Constitutional:      Appearance: Normal appearance.  Pulmonary:     Effort: Pulmonary effort is normal.  Neurological:     Mental Status: He is alert.  Psychiatric:        Mood and Affect: Mood normal.        Behavior: Behavior normal.        Thought Content: Thought content normal.        Judgment: Judgment normal.   Last 3 Office BP readings: BP Readings from Last 3  Encounters:  12/28/23 120/84  11/02/23 120/72  10/24/23 (!) 134/98   Basic Metabolic Panel    Component Value Date/Time   NA 138 12/28/2023 1012   K 4.3 12/28/2023 1012   CL 102 12/28/2023 1012   CO2 24 12/28/2023 1012   GLUCOSE 103 (H) 12/28/2023 1012   GLUCOSE 121 (H) 09/09/2023 0101   BUN 15 12/28/2023 1012   CREATININE 1.21 12/28/2023 1012   CALCIUM 9.2 12/28/2023 1012   GFRNONAA >60 09/09/2023 0101   GFRAA >60 02/07/2020 0829    ABPM Study Data: Arm Placement left arm  For Office Goal BP of <130/80 mmHg:  ABPM thresholds: Overall BP <125/75 mmHg, daytime BP <130/80 mmHg, sleeptime BP <110/65 mmHg    A/P: History of hypertension longstanding 3 years (2022) currently taking no BP medications; with goal pressure of <130/80. Patient is open to any plan regarding tobacco cessation. -Placed blood pressure cuff, provided education, patient instructed to wear cuff for 24 hours and return tomorrow to review results.   History of tobacco use disorder.  Recently quit for > 1 year but relapsed about 1 year ago due to stress.  Currently smoking 6-7 cigs per day.  Willing to consider tobacco reduction and cessation but admits it feels harder now to quit.  Denies use of any tobacco cessation therapy in the past.   - Discussed goal to reduce intake to 5 cigarettes or less per day.  - Continue to address tomorrow  at BP follow-up   Written patient instructions provided including activity/symptom/event log. Patient verbalized understanding of plan. Total time in face to face counseling 22 minutes.    Follow-up: Tomorrow AM - early morning appointment 8:30am  Patient seen with Threasa Heads, PharmD Candidate.

## 2024-01-03 NOTE — Assessment & Plan Note (Signed)
 History of hypertension longstanding 3 years (2022) currently taking no BP medications; with goal pressure of <130/80. Patient is open to any plan regarding tobacco cessation. -Placed blood pressure cuff, provided education, patient instructed to wear cuff for 24 hours and return tomorrow to review results.

## 2024-01-03 NOTE — Patient Instructions (Signed)
 Blood Pressure Activity Diary Time Lying down/ Sleeping Walking/ Exercise Stressed/ Angry Headache/ Pain Dizzy  9 AM       10 AM       11 AM       12 PM       1 PM       2 PM       Time Lying down/ Sleeping Walking/ Exercise Stressed/ Angry Headache/ Pain Dizzy  3 PM       4 PM        5 PM       6 PM       7 PM       8 PM       Time Lying down/ Sleeping Walking/ Exercise Stressed/ Angry Headache/ Pain Dizzy  9 PM       10 PM       11 PM       12 AM       1 AM       2 AM       3 AM       Time Lying down/ Sleeping Walking/ Exercise Stressed/ Angry Headache/ Pain Dizzy  4 AM       5 AM       6 AM       7 AM       8 AM       9 AM       10 AM        Time you woke up: _________                  Time you went to sleep:__________  Come back tomorrow at 8:30 a.m. to have the monitor removed  Call the [facility] if you have any questions before then ([phone])  Wearing the Blood Pressure Monitor The cuff will inflate every 20 minutes during the day and every 30 minutes while you sleep. Fill out the blood pressure-activity diary during the day, especially during activities that may affect your reading -- such as exercise, stress, walking, taking your blood pressure medications  Important things to know: Avoid taking the monitor off for the next 24 hours, unless it causes you discomfort or pain. Do NOT get the monitor wet and do NOT try to clean the monitor with any cleaning products. Do NOT put the monitor on anyone else's arm. When the cuff inflates, avoid excess movement. Let the cuffed arm hang loosely, slightly away from the body. Avoid flexing the muscles or moving the hand/fingers. Remember to fill out the blood pressure activity diary. If you experience severe pain or unusual pain (not associated with getting your blood pressure checked), remove the monitor.  Troubleshooting:  Code  Troubleshooting   1  Check cuff position, tighten cuff   2, 3  Remain  still during reading   4, 87  Check air hose connections and make sure cuff is tight   85, 89  Check hose connections and make tubing is not crimped   86  Push START/STOP to restart reading   88, 91  Retry by pushing START/STOP   90  Replace batteries. If problem persists, remove monitor and bring back to   clinic at follow up   97, 98, 99  Service required - Remove monitor and bring back to clinic at follow up

## 2024-01-03 NOTE — Assessment & Plan Note (Signed)
 History of tobacco use disorder.  Recently quit for > 1 year but relapsed about 1 year ago due to stress.  Currently smoking 6-7 cigs per day.  Willing to consider tobacco reduction and cessation but admits it feels harder now to quit.  Denies use of any tobacco cessation therapy in the past.   - Discussed goal to reduce intake to 5 cigarettes or less per day.  - Continue to address tomorrow at BP follow-up

## 2024-01-04 ENCOUNTER — Ambulatory Visit (INDEPENDENT_AMBULATORY_CARE_PROVIDER_SITE_OTHER): Admitting: Pharmacist

## 2024-01-04 ENCOUNTER — Encounter: Payer: Self-pay | Admitting: Pharmacist

## 2024-01-04 VITALS — BP 133/90 | Wt 230.0 lb

## 2024-01-04 DIAGNOSIS — I10 Essential (primary) hypertension: Secondary | ICD-10-CM

## 2024-01-04 MED ORDER — VALSARTAN 40 MG PO TABS: 40.0000 mg | ORAL_TABLET | Freq: Every day | ORAL | 1 refills | Status: DC

## 2024-01-04 NOTE — Assessment & Plan Note (Signed)
 History of hypertension longstanding 3 years (2022), however currently taking no medications; with goal presssure of <130/80. Found to have persistently elevated and uncontrolled blood pressure with 24-hour ambulatory blood pressure evaluation which demonstrates an average AWAKE blood pressure of 133/90 mmHg. Nocturnal dipping pattern is abnormal  , reports dipping pattern to be 22.9% systolic and 29.5% diastolic.   Changes to medications -Started valsartan 40 mg once daily -Counseled on non-pharmacological strategies, mainly centered around lowering salt intake

## 2024-01-04 NOTE — Progress Notes (Signed)
 S:     Chief Complaint  Patient presents with   Medication Management    Amb BP - Day #2   33 y.o. male who presents for hypertension evaluation, education, and management.  Patient arrives in good spirits and presents without any assistance.   PMH is significant for HTN, asthma, GERD, OSA, history of tobacco use.  Patient reports they were able to wear the Ambulatory Blood Pressure Cuff for the entire 24 evaluation period.   Family history: father, grandparents (HTN), grandma (kidney disease)  O:  Review of Systems  All other systems reviewed and are negative.   Physical Exam Vitals reviewed.  Constitutional:      Appearance: Normal appearance.  Pulmonary:     Effort: Pulmonary effort is normal.  Neurological:     Mental Status: He is alert.  Psychiatric:        Mood and Affect: Mood normal.        Behavior: Behavior normal.        Thought Content: Thought content normal.        Judgment: Judgment normal.     Last 3 Office BP readings: BP Readings from Last 3 Encounters:  01/03/24 (!) 144/93  12/28/23 120/84  11/02/23 120/72    Basic Metabolic Panel    Component Value Date/Time   NA 138 12/28/2023 1012   K 4.3 12/28/2023 1012   CL 102 12/28/2023 1012   CO2 24 12/28/2023 1012   GLUCOSE 103 (H) 12/28/2023 1012   GLUCOSE 121 (H) 09/09/2023 0101   BUN 15 12/28/2023 1012   CREATININE 1.21 12/28/2023 1012   CALCIUM 9.2 12/28/2023 1012   GFRNONAA >60 09/09/2023 0101   GFRAA >60 02/07/2020 0829    Renal function: Estimated Creatinine Clearance: 98.5 mL/min (by C-G formula based on SCr of 1.21 mg/dL).   ABPM Study Data: Arm Placement left arm  Overall Mean 24hr BP:   122/80 mmHg  HR: 88  Daytime Mean BP:  133/90 mmHg  HR: 97  Nighttime Mean BP:  103/63 mmHg  HR: 72  Dipping Pattern: Yes.    Sys:   22.9   Dia: 29.5   [normal dipping ~10-20%]   For Office Goal BP of <130/80 mmHg:  ABPM thresholds: Overall BP <125/75 mmHg, daytime BP <130/80 mmHg,  sleeptime BP <110/65 mmHg    A/P: History of hypertension longstanding 3 years (2022), however currently taking no medications; with goal presssure of <130/80. Found to have persistently elevated and uncontrolled blood pressure with 24-hour ambulatory blood pressure evaluation which demonstrates an average AWAKE blood pressure of 133/90 mmHg. Nocturnal dipping pattern is abnormal  , reports dipping pattern to be 22.9% systolic and 29.5% diastolic .   Changes to medications -Started valsartan 40 mg once daily -Counseled on non-pharmacological strategies, mainly centered around lowering salt intake  History of tobacco use disorder. Recently quit for > 1 year but relapsed about 1 year ago due to stress. Currently smoking 6-7 cigs per day. Willing to consider tobacco reduction and cessation but admits it feels harder now to quit. Denies use of any tobacco cessation therapy in the past. Unable to address today -Plan to possibly reassess for next appointment on 3/26  Results reviewed and written information provided.    Written patient instructions provided. Patient verbalized understanding of treatment plan.  Total time in face to face counseling 22 minutes.    Follow-up:  Pharmacist Dr. Raymondo Band PCP clinic visit in 01/18/24  Patient seen with Threasa Heads, PharmD Candidate.

## 2024-01-04 NOTE — Patient Instructions (Signed)
 It was nice to see you today!  Your goal blood pressure is <130/80 mmHg.  Medication Changes: START valsartan 40 mg once daily  Continue all other medication the same.   Keep up the good work with diet and exercise. Aim for a diet full of vegetables, fruit and lean meats (chicken, [place], fish). Try to limit salt intake by eating fresh or frozen vegetables (instead of canned), rinse canned vegetables prior to cooking and do not add any additional salt to meals.

## 2024-01-05 ENCOUNTER — Other Ambulatory Visit: Payer: Self-pay | Admitting: Family Medicine

## 2024-01-05 DIAGNOSIS — I10 Essential (primary) hypertension: Secondary | ICD-10-CM

## 2024-01-06 ENCOUNTER — Encounter: Payer: Self-pay | Admitting: Family Medicine

## 2024-01-06 NOTE — Progress Notes (Signed)
 Reviewed and agree with Dr Macky Lower plan.

## 2024-01-06 NOTE — Assessment & Plan Note (Signed)
 Doing well with CPAP and will continue use regularly.

## 2024-01-18 ENCOUNTER — Encounter: Payer: Self-pay | Admitting: Pharmacist

## 2024-01-18 ENCOUNTER — Ambulatory Visit: Admitting: Pharmacist

## 2024-01-18 VITALS — BP 134/100 | HR 96 | Wt 229.6 lb

## 2024-01-18 DIAGNOSIS — F17200 Nicotine dependence, unspecified, uncomplicated: Secondary | ICD-10-CM | POA: Diagnosis not present

## 2024-01-18 DIAGNOSIS — I10 Essential (primary) hypertension: Secondary | ICD-10-CM

## 2024-01-18 NOTE — Progress Notes (Signed)
 Reviewed and agree with Dr Macky Lower plan.

## 2024-01-18 NOTE — Assessment & Plan Note (Signed)
 Hypertension - remains uncontrolled today.  Did not start valsartan due to concerns of perceived high strength (number of milligrams).  Educated on potency and milligrams.  -Patient reports being willing to starting medication today, it has been picked up from his pharmacy -BMET at visit with Dr. Jennette Kettle

## 2024-01-18 NOTE — Patient Instructions (Addendum)
 Nice to see you today!   Medication Changes: START valsartan 40 mg daily  Continue all other medication the same.    Tobacco Patient Instructions  Quitting smoking is one of the most important decisions you can make for your current and future health. Consider what you dislike about smoking and how quitting could personally benefit you. Try to cut down.   My target quit date is: Create a quit day within the next 1-2 months  Starting today, Be a Quitter!  Remind yourself why you want to quit.  Delay your first cigarette of the day for as long as possible.  Start cleaning out all pockets, drawers, and your car of cigarettes.  Getting Through the Cravings Once You Are Smoke Free: Each craving will last about 10 minutes, whether or not you smoke. Here's how to get through the cravings without cigarettes:  DELAY: Tell yourself that you'll wait for the next craving. Do it every time! DEEP BREATHS: One reason smoking feels good is because you breathe in deeply to inhale. Take four slow, deep breaths and feel the relaxation without the hamful effects of cigarettes. DRINK WATER: Drink a glass of cool water. It will give your hands and mouth something to do and will help flush the nicotine out of your system faster. DIVERT: Do something else -- brush your teeth, take a walk, call a friend who can offer you support. Just moving onto something other than thinking about cigarettes will move you through the craving.   Frequently Asked Questions  What can I do when I get the urge to smoke? To get through the urge to smoke, try the following:  Review your reasons for quitting and think of all the benefits to your health, your finances, and your family.  Remind yourself that there is no such thing as just one cigarette -- or even one puff.  Ride out the desire to smoke. Use the 4 Os -- Delay, Deep Breaths, Drink Water and Divert to get you through. The craving will go away eventually. Do not fool  yourself into thinking you can have just one cigarette.  Any tips on how to deal with stress? Stress is a natural part of life. The key is to deal with it without reaching for a cigarette. Taking deep breaths, counting backwards from 10 and asking yourself 1-how big a deal is this?"  Writing down your feelings, talking with a friend and doing things like positive self-talk and meditation are some other ways that people deal with daily stress.  What if I start smoking again? Slips happen. Most people try to quit smoking a few times before they are successful. Don't beat yourself up if this happens to you! Ask yourself if this was a slip or a relapse. A slip is a one-time mistake that is quickly corrected. A relapse is going back to your old smoking habits.   If you slip, don't give up. Think of it as a learning experience. Ask yourself what went wrong and renew your commitment to staying away from smoking for good.  If you relapse, try not to get discouraged. Ask yourself the question "What caused me to start smoking?" Figure out what helped you and what didn't when you tried to quit. Knowing why you relapsed is useful information for your next attempt to quit.

## 2024-01-18 NOTE — Assessment & Plan Note (Signed)
 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   -Encouraged patient to attempt to quit lunch cigarette -Smoking seems to occur around meals, denies smoking in the car  -Advocated for making a quit date - reassess progress at next visit with Dr. Jennette Kettle, PCP 4/9

## 2024-01-18 NOTE — Progress Notes (Signed)
   S:   Chief Complaint  Patient presents with   Medication Management    BP Follow up and Tobacco Use   33 y.o. male who presents for evaluation/assistance with tobacco dependence.  PMH is significant for HTN, tobacco use disorder.   Patient was referred and last seen by me on 01/04/2024.  Seen by PCP, Dr. Jennette Kettle 12/28/2023.  At last visit, valsartan 40 mg was started following blood pressure monitor.   Age when started using tobacco on a daily basis started smoking again in last March (2024). Brand smoked [employer]. Number of cigarettes/day 7-8.  Estimated nicotine content per cigarette (mg) 1.  Estimated nicotine intake per day 7-8 mg.   Denies waking to smoke. His most recent quit attempt over a year ago. Has quit before in the past and quit for 2 years. Most common triggers to use tobacco include; stress from work and tends to smoke around meals.   Motivation to quit: effects on lungs  O: Clinical ASCVD: No  The ASCVD Risk score (Arnett DK, et al., 2019) failed to calculate for the following reasons:   The 2019 ASCVD risk score is only valid for ages 67 to 95  Review of Systems  All other systems reviewed and are negative.   Physical Exam Pulmonary:     Effort: Pulmonary effort is normal.  Neurological:     Mental Status: He is alert.  Psychiatric:        Mood and Affect: Mood normal.        Thought Content: Thought content normal.        Judgment: Judgment normal.       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   -Encouraged patient to attempt to quit lunch cigarette -Smoking seems to occur around meals, denies smoking in the car  -Advocated for making a quit date - reassess progress at next visit with Dr. Jennette Kettle, PCP 4/9  Hypertension - remains uncontrolled today.  Did not start valsartan due to concerns of perceived high strength  (number of milligrams).  Educated on potency and milligrams.  -Patient reports being willing to starting medication today, it has been picked up from his pharmacy -BMET at visit with Dr. Jennette Kettle  Written patient instructions provided. Patient verbalized understanding of treatment plan.  Total time in face to face counseling 21 minutes.    Follow-up:  Pharmacist visit - TBD based on blood pressure and progress with tobacco cessation in the next two weeks.  PCP clinic visit 02/01/2024 with Dr. Jennette Kettle Patient seen with Threasa Heads, PharmD Candidate and Mack Guise, PharmD Candidate.

## 2024-02-01 ENCOUNTER — Ambulatory Visit: Payer: Self-pay | Admitting: Family Medicine

## 2024-02-03 ENCOUNTER — Ambulatory Visit: Payer: Self-pay | Admitting: Family Medicine

## 2024-06-26 LAB — LAB REPORT - SCANNED: EGFR: 83
